# Patient Record
Sex: Male | Born: 1985 | Race: White | Hispanic: No | Marital: Single | Smoking: Current every day smoker
Health system: Southern US, Community
[De-identification: ages and names within clinical notes are randomized; demographics above are authoritative.]

---
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
## 2004-07-11 ENCOUNTER — Emergency Department (HOSPITAL_COMMUNITY): Admission: EM | Admit: 2004-07-11 | Discharge: 2004-07-11 | Payer: Self-pay | Admitting: Emergency Medicine
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
## 2012-11-10 ENCOUNTER — Ambulatory Visit (INDEPENDENT_AMBULATORY_CARE_PROVIDER_SITE_OTHER): Payer: Self-pay | Admitting: Family Medicine

## 2012-11-10 ENCOUNTER — Encounter: Payer: Self-pay | Admitting: Family Medicine

## 2012-11-10 VITALS — BP 132/86 | Ht 68.0 in | Wt 210.0 lb

## 2012-11-10 DIAGNOSIS — R454 Irritability and anger: Secondary | ICD-10-CM

## 2012-11-10 DIAGNOSIS — R4589 Other symptoms and signs involving emotional state: Secondary | ICD-10-CM

## 2012-11-10 MED ORDER — CITALOPRAM HYDROBROMIDE 20 MG PO TABS: 20.0000 mg | ORAL_TABLET | Freq: Every day | ORAL | Status: DC

## 2012-11-10 NOTE — Progress Notes (Signed)
  Subjective:    Patient ID: Timothy Mccoy, male    DOB: Mar 11, 1985, 27 y.o.   MRN: [id number]  HPIHere for behavior issues and anger issues.   This young man has some difficult time controlling his anger he has times where he gets a little bit upset he denies wanting to hurt himself or anyone else. He states he once to try some medication. He relates that one time he had ADD issues. He also has had some issues in the past getting angry quickly but he denies being homicidal or suicidal.  Review of Systems Negative for headaches fevers vomiting    Objective:   Physical Exam Lungs are clear hearts regular patient is able to smile and interact no unusual behavior       Assessment & Plan:  Anger issues occasional feeling blue-Celexa 20 mg half tablet daily for the first week then one tablet daily followup in 2 weeks if suicidal tendencies or severe depression immediately seek help here or ER  Referral to date [facility] for behavioral evaluation and treatment  Hold off on ADD medicines currently

## 2012-11-24 ENCOUNTER — Encounter: Payer: Self-pay | Admitting: Family Medicine

## 2012-11-24 ENCOUNTER — Ambulatory Visit (INDEPENDENT_AMBULATORY_CARE_PROVIDER_SITE_OTHER): Payer: Self-pay | Admitting: Family Medicine

## 2012-11-24 VITALS — BP 130/90 | Ht 70.0 in | Wt 212.0 lb

## 2012-11-24 DIAGNOSIS — Z569 Unspecified problems related to employment: Secondary | ICD-10-CM

## 2012-11-24 DIAGNOSIS — F988 Other specified behavioral and emotional disorders with onset usually occurring in childhood and adolescence: Secondary | ICD-10-CM

## 2012-11-24 DIAGNOSIS — Z566 Other physical and mental strain related to work: Secondary | ICD-10-CM

## 2012-11-24 MED ORDER — AMPHETAMINE-DEXTROAMPHET ER 20 MG PO CP24: 20.0000 mg | ORAL_CAPSULE | ORAL | Status: DC

## 2012-11-24 NOTE — Progress Notes (Signed)
  Subjective:    Patient ID: Timothy Mccoy, male    DOB: July 29, 1985, 27 y.o.   MRN: [id number]  Anxiety Presents for follow-up visit. Onset was 1 to 6 months ago. The problem has been gradually improving. Symptoms occur rarely. The severity of symptoms is mild. Nothing aggravates the symptoms. The quality of sleep is poor. Nighttime awakenings: several.   There are no known risk factors. Past treatments include SSRIs. The treatment provided moderate relief. Compliance with prior treatments has been good. Compliance with medications is 76-100%.   Patient does relate that he's had a cold recently head congestion drainage coughing denies any wheezing vomiting or high fevers.  Patient does relate Celexa is helping greatly with his moods he is having a hard time focusing he would like to try ADD medicine again. He had ADD as it use. Been having problems recently staying focused in on track.   Review of Systems Denies headaches sweats chills fevers nausea vomiting diarrhea    Objective:   Physical Exam Lungs are clear hearts regular pulse normal extremities no edema skin warm dry neurologic grossly normal       Assessment & Plan:  Viral URI if he doesn't shake it and then patient is to let us know Stress-Celexa seems to be doing good denies being depressed no suicidal ideation Patient is aware that if he starts feeling depressed or suicidal ideation immediately to followup(he was warned that medication could cause these type of side effect) ADD- Adderall XR prescribed. We will see how that does.

## 2013-02-25 ENCOUNTER — Ambulatory Visit: Payer: Self-pay | Admitting: Family Medicine

## 2013-03-04 ENCOUNTER — Ambulatory Visit (INDEPENDENT_AMBULATORY_CARE_PROVIDER_SITE_OTHER): Payer: 59 | Admitting: Family Medicine

## 2013-03-04 ENCOUNTER — Encounter: Payer: Self-pay | Admitting: Family Medicine

## 2013-03-04 VITALS — BP 122/80 | Temp 99.1°F | Ht 70.0 in | Wt 216.0 lb

## 2013-03-04 DIAGNOSIS — J019 Acute sinusitis, unspecified: Secondary | ICD-10-CM

## 2013-03-04 DIAGNOSIS — K219 Gastro-esophageal reflux disease without esophagitis: Secondary | ICD-10-CM

## 2013-03-04 DIAGNOSIS — F988 Other specified behavioral and emotional disorders with onset usually occurring in childhood and adolescence: Secondary | ICD-10-CM

## 2013-03-04 DIAGNOSIS — K649 Unspecified hemorrhoids: Secondary | ICD-10-CM

## 2013-03-04 DIAGNOSIS — L301 Dyshidrosis [pompholyx]: Secondary | ICD-10-CM

## 2013-03-04 MED ORDER — PANTOPRAZOLE SODIUM 40 MG PO TBEC: 40.0000 mg | DELAYED_RELEASE_TABLET | Freq: Every day | ORAL | Status: DC

## 2013-03-04 MED ORDER — AMPHETAMINE-DEXTROAMPHET ER 20 MG PO CP24: 20.0000 mg | ORAL_CAPSULE | ORAL | Status: DC

## 2013-03-04 MED ORDER — CITALOPRAM HYDROBROMIDE 20 MG PO TABS: 20.0000 mg | ORAL_TABLET | Freq: Every day | ORAL | Status: DC

## 2013-03-04 MED ORDER — SULFAMETHOXAZOLE-TRIMETHOPRIM 800-160 MG PO TABS: 1.0000 | ORAL_TABLET | Freq: Two times a day (BID) | ORAL | Status: DC

## 2013-03-04 MED ORDER — TRIAMCINOLONE ACETONIDE 0.1 % EX CREA: 1.0000 "application " | TOPICAL_CREAM | Freq: Two times a day (BID) | CUTANEOUS | Status: DC

## 2013-03-04 NOTE — Patient Instructions (Signed)

## 2013-03-04 NOTE — Progress Notes (Signed)
   Subjective:    Patient ID: Timothy Mccoy, male    DOB: 04/17/1985, 28 y.o.   MRN: [id number]  HPIADD check up. He states it helps. Patient states does help his focus that helps him stay focused at work he likes the way it helps him he would like to continue it he denies abusing it. Patient was told that it was be impossible to cover every single problem he is having today.  Right foot pain for two years. Uses lotion on the spot that is hurting. He relates he gets intermittent itching that he gets small bumps under the skin then they often blister a little bit then itch intensely.  Right arm pain. Hurt right arm bowling 2 - 3 months ago.   Loss voice about 1 month ago. Had a bad cold, then lost voice, some phlehgm , no fever, cough to the point of losing breath, no wheeze, gets light headed with coughing No chills, no sweats  Vomiting every morning for 2 -3 years. Has regular issues in the am with vomiting. Has reflux Sx , has had some nocturnal symptoms. Patient relates a fair amount of reflux symptoms burning in the chest no wheezing or difficulty breathing with it. He's had this ongoing for years just hadn't taken anything because he didn't have insurance until now  Hemorroids. He was advised to use stool softeners if ongoing trouble may need referral to surgeon  Review of Systems  Constitutional: Negative for fever and activity change.  HENT: Positive for congestion and rhinorrhea. Negative for ear pain.   Eyes: Negative for discharge.  Respiratory: Positive for cough. Negative for wheezing.   Cardiovascular: Negative for chest pain.       Objective:   Physical Exam  Nursing note and vitals reviewed. Constitutional: He appears well-developed.  HENT:  Head: Normocephalic.  Mouth/Throat: Oropharynx is clear and moist. No oropharyngeal exudate.  Neck: Normal range of motion.  Cardiovascular: Normal rate, regular rhythm and normal heart sounds.   No murmur  heard. Pulmonary/Chest: Effort normal and breath sounds normal. He has no wheezes.  Lymphadenopathy:    He has no cervical adenopathy.  Neurological: He exhibits normal muscle tone.  Skin: Skin is warm and dry.          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 GERD-he needs to minimize spicy foods tomato-based products. Also encouraged him to take the prescription medicine on a daily basis if not doing better over the course of next month to let us know I don't feel he needs EGD at this time. SINUSITIS-I believe the hoarseness in his voice is related to sinus infection postnasal drainage he will go ahead with antibiotics if not better in 2 weeks' time he'll let us know ADD-he was given 3 prescriptions today this will last him for 3 months he'll followup at that time

## 2013-03-11 ENCOUNTER — Telehealth: Payer: Self-pay | Admitting: Family Medicine

## 2013-03-11 NOTE — Telephone Encounter (Signed)
Pt's AMPHETAMINE/DEXTROAMPHETAMINE was DENIED by [facility]([employer]), please see denial on front of paper chart, please advise

## 2013-03-11 NOTE — Telephone Encounter (Signed)
I can prescribe the brand name. May still need prior approval. Wouldn't surprise me if are friendly insurance company denies this and makes us try something different. Certainly would help if they told us which medicine they would cover in the first place.

## 2013-03-11 NOTE — Telephone Encounter (Signed)
[facility] at cell# [phone]

## 2013-03-12 NOTE — Telephone Encounter (Signed)
Dr. Lorin PicketScott wrote new Rx for pt, is up front for pick up, notified pt, he states he will pick up when he can, due to work schedule may be a while

## 2013-04-15 ENCOUNTER — Telehealth: Payer: Self-pay | Admitting: Family Medicine

## 2013-04-15 NOTE — Telephone Encounter (Signed)
If possible please find out from the patient what insurance he currently has. We can send in the prescription to his pharmacy.

## 2013-04-15 NOTE — Telephone Encounter (Signed)
Patients insurance is not long covering pantoprazole (PROTONIX) 40 MG tablet and he said he had a bad spell last night and would like an alternative called in if any way.   [employer] [facility]

## 2013-04-16 ENCOUNTER — Other Ambulatory Visit: Payer: Self-pay | Admitting: Family Medicine

## 2013-04-16 NOTE — Telephone Encounter (Signed)
[employer]

## 2013-04-16 NOTE — Telephone Encounter (Signed)
Enid DerryBrendale, if possible could you get me [facility] is list for proton pump inhibitors thank you

## 2013-04-16 NOTE — Telephone Encounter (Signed)
See list on top of paper chart

## 2013-04-21 NOTE — Telephone Encounter (Signed)
Send in Prilosec 20 mg 1 daily, #30, 4 refills. Apparently this is the only thing that is covered by his insurance. Thank you.

## 2013-04-22 ENCOUNTER — Other Ambulatory Visit: Payer: Self-pay | Admitting: *Deleted

## 2013-04-22 MED ORDER — OMEPRAZOLE 20 MG PO CPDR: 20.0000 mg | DELAYED_RELEASE_CAPSULE | Freq: Every day | ORAL | Status: DC

## 2013-04-22 NOTE — Telephone Encounter (Signed)
Med sent to pharm. Pt notified.  

## 2013-05-19 ENCOUNTER — Telehealth: Payer: Self-pay | Admitting: Family Medicine

## 2013-05-19 MED ORDER — AMPHETAMINE-DEXTROAMPHET ER 20 MG PO CP24: 20.0000 mg | ORAL_CAPSULE | ORAL | Status: DC

## 2013-05-19 NOTE — Telephone Encounter (Signed)
Wife was notified script ready for pickup and keep follow up visit.

## 2013-05-19 NOTE — Telephone Encounter (Signed)
May have refill. Will need office visit before further refills.

## 2013-05-19 NOTE — Telephone Encounter (Signed)
Calling to get refills on patients Adderall.

## 2013-05-19 NOTE — Telephone Encounter (Signed)
Last seen 03/04/13 

## 2013-06-01 ENCOUNTER — Ambulatory Visit (INDEPENDENT_AMBULATORY_CARE_PROVIDER_SITE_OTHER): Payer: 59 | Admitting: Family Medicine

## 2013-06-01 ENCOUNTER — Encounter: Payer: Self-pay | Admitting: Family Medicine

## 2013-06-01 VITALS — BP 128/80 | Ht 70.0 in | Wt 208.0 lb

## 2013-06-01 DIAGNOSIS — Z23 Encounter for immunization: Secondary | ICD-10-CM

## 2013-06-01 DIAGNOSIS — F988 Other specified behavioral and emotional disorders with onset usually occurring in childhood and adolescence: Secondary | ICD-10-CM

## 2013-06-01 DIAGNOSIS — Z131 Encounter for screening for diabetes mellitus: Secondary | ICD-10-CM

## 2013-06-01 DIAGNOSIS — Z13220 Encounter for screening for lipoid disorders: Secondary | ICD-10-CM

## 2013-06-01 LAB — LIPID PANEL
CHOL/HDL RATIO: 5 ratio
Cholesterol: 169 mg/dL (ref 0–200)
HDL: 34 mg/dL — AB (ref >39)
LDL CALC: 97 mg/dL (ref 0–99)
TRIGLYCERIDES: 188 mg/dL — AB (ref <150)
VLDL: 38 mg/dL (ref 0–40)

## 2013-06-01 LAB — GLUCOSE, RANDOM: Glucose, Bld: 81 mg/dL (ref 70–99)

## 2013-06-01 MED ORDER — AMPHETAMINE-DEXTROAMPHET ER 20 MG PO CP24: 20.0000 mg | ORAL_CAPSULE | ORAL | Status: DC

## 2013-06-01 NOTE — Patient Instructions (Signed)
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
## 2013-06-02 ENCOUNTER — Encounter: Payer: Self-pay | Admitting: Family Medicine

## 2013-08-27 ENCOUNTER — Telehealth: Payer: Self-pay | Admitting: Family Medicine

## 2013-08-27 NOTE — Telephone Encounter (Signed)
Patient does not need prior authorization for Adderall. He may go ahead and fill his Rx.

## 2013-08-31 ENCOUNTER — Ambulatory Visit: Payer: 59 | Admitting: Family Medicine

## 2013-08-31 ENCOUNTER — Encounter: Payer: Self-pay | Admitting: Family Medicine

## 2013-08-31 ENCOUNTER — Ambulatory Visit (INDEPENDENT_AMBULATORY_CARE_PROVIDER_SITE_OTHER): Payer: 59 | Admitting: Family Medicine

## 2013-08-31 VITALS — BP 132/88 | Ht 70.0 in | Wt 200.0 lb

## 2013-08-31 DIAGNOSIS — F988 Other specified behavioral and emotional disorders with onset usually occurring in childhood and adolescence: Secondary | ICD-10-CM

## 2013-08-31 MED ORDER — AMPHETAMINE-DEXTROAMPHET ER 20 MG PO CP24: 20.0000 mg | ORAL_CAPSULE | ORAL | Status: DC

## 2013-08-31 MED ORDER — CITALOPRAM HYDROBROMIDE 20 MG PO TABS: 20.0000 mg | ORAL_TABLET | Freq: Every day | ORAL | Status: DC

## 2013-08-31 NOTE — Progress Notes (Signed)
   Subjective:    Patient ID: Timothy Mccoy, male    DOB: 11/21/1985, 28 y.o.   MRN: [id number]  HPI Patient is here today for 3 month check up on his ADD. He needs a refill on his Adderall.   No concerns.  Patient was seen today for ADD checkup. -weight, vital signs reviewed.  The following items were covered. -Compliance with medication : Overall good  -Problems with completing homework, paying attention/taking good notes in school: This helps his focus at work  -grades: Nonapplicable  - Eating patterns : Doing well has lost some weight trying to lose weight  -sleeping: Sleeps fine no trouble  -Additional issues or questions: He is uncertain if his insurance company will cover his medicine he's done well in this medicine I would like for him to stay on    Review of Systems  Constitutional: Negative for activity change, appetite change and fatigue.  Gastrointestinal: Negative for abdominal pain.  Neurological: Negative for headaches.  Psychiatric/Behavioral: Negative for behavioral problems.       Objective:   Physical Exam  Vitals reviewed. Constitutional: He appears well-nourished.  Cardiovascular: Normal rate, regular rhythm and normal heart sounds.   No murmur heard. Pulmonary/Chest: Effort normal and breath sounds normal.  Musculoskeletal: He exhibits no edema.  Lymphadenopathy:    He has no cervical adenopathy.  Neurological: He is alert.  Psychiatric: His behavior is normal.          Assessment & Plan:  ADD-prescriptions given. Patient handling stress at work fairly well. He denies any specific troubles otherwise. He will followup in 3 months sooner if any issues

## 2013-09-01 ENCOUNTER — Encounter: Payer: Self-pay | Admitting: Family Medicine

## 2013-10-20 ENCOUNTER — Ambulatory Visit (INDEPENDENT_AMBULATORY_CARE_PROVIDER_SITE_OTHER): Payer: 59 | Admitting: Family Medicine

## 2013-10-20 ENCOUNTER — Encounter: Payer: Self-pay | Admitting: Family Medicine

## 2013-10-20 VITALS — BP 134/82 | Temp 98.6°F | Ht 70.0 in

## 2013-10-20 DIAGNOSIS — K1120 Sialoadenitis, unspecified: Secondary | ICD-10-CM

## 2013-10-20 MED ORDER — CHLORZOXAZONE 500 MG PO TABS: 500.0000 mg | ORAL_TABLET | Freq: Three times a day (TID) | ORAL | Status: DC | PRN

## 2013-10-20 MED ORDER — CLINDAMYCIN HCL 300 MG PO CAPS: 300.0000 mg | ORAL_CAPSULE | Freq: Three times a day (TID) | ORAL | Status: AC

## 2013-10-20 NOTE — Progress Notes (Signed)
   Subjective:    Patient ID: Timothy Mccoy, male    DOB: [birth year], 28 y.o.   MRN: [id number]  HPI Patient has been having this issue since last week. The pain is on the right side & causing difficulty swallowing. Patient states that he & his family had a URI prior to this issue. The patient denies chest tightness pressure pain relates soreness underneath the tongue on the right side does not chew tobacco. Denies any bleeding from it or fevers.  Review of Systems     Objective:   Physical Exam  This patient has a swollen tender every morning this underneath the tongue on the right side. There is no mass felt with it. The neck is normal lungs are clear heart is regular in your drums normal      Assessment & Plan:  Salivary gland infection-antibiotics prescribed if not doing well in 7 days followup followup sooner if high fevers or worse

## 2013-12-01 ENCOUNTER — Ambulatory Visit (INDEPENDENT_AMBULATORY_CARE_PROVIDER_SITE_OTHER): Payer: 59 | Admitting: Family Medicine

## 2013-12-01 ENCOUNTER — Other Ambulatory Visit: Payer: Self-pay | Admitting: Family Medicine

## 2013-12-01 ENCOUNTER — Encounter: Payer: Self-pay | Admitting: Family Medicine

## 2013-12-01 ENCOUNTER — Ambulatory Visit (INDEPENDENT_AMBULATORY_CARE_PROVIDER_SITE_OTHER): Payer: 59 | Admitting: *Deleted

## 2013-12-01 VITALS — BP 134/90 | Ht 70.0 in | Wt 200.4 lb

## 2013-12-01 DIAGNOSIS — F988 Other specified behavioral and emotional disorders with onset usually occurring in childhood and adolescence: Secondary | ICD-10-CM

## 2013-12-01 DIAGNOSIS — F909 Attention-deficit hyperactivity disorder, unspecified type: Secondary | ICD-10-CM

## 2013-12-01 DIAGNOSIS — Z23 Encounter for immunization: Secondary | ICD-10-CM

## 2013-12-01 DIAGNOSIS — J069 Acute upper respiratory infection, unspecified: Secondary | ICD-10-CM

## 2013-12-01 MED ORDER — AMPHETAMINE-DEXTROAMPHET ER 20 MG PO CP24: 20.0000 mg | ORAL_CAPSULE | ORAL | Status: DC

## 2013-12-01 MED ORDER — OMEPRAZOLE 20 MG PO CPDR: 20.0000 mg | DELAYED_RELEASE_CAPSULE | Freq: Every day | ORAL | Status: DC

## 2013-12-01 MED ORDER — CITALOPRAM HYDROBROMIDE 20 MG PO TABS: 20.0000 mg | ORAL_TABLET | Freq: Every day | ORAL | Status: DC
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
## 2013-12-01 NOTE — Progress Notes (Deleted)
   Subjective:    Patient ID: Timothy Mccoy, male    DOB: 04/06/1985, 28 y.o.   MRN: [id number]  HPI Patient was seen today for ADD checkup. -weight, vital signs reviewed.  The following items were covered. -Compliance with medication : ***  -Problems with completing homework, paying attention/taking good notes in school: ***  -grades: ***  - Eating patterns : ***  -sleeping: ***  -Additional issues or questions: ***     Review of Systems     Objective:   Physical Exam        Assessment & Plan:

## 2014-02-25 ENCOUNTER — Ambulatory Visit (INDEPENDENT_AMBULATORY_CARE_PROVIDER_SITE_OTHER): Payer: 59 | Admitting: Family Medicine

## 2014-02-25 ENCOUNTER — Encounter: Payer: Self-pay | Admitting: Family Medicine

## 2014-02-25 VITALS — BP 136/82 | Ht 70.0 in | Wt 203.0 lb

## 2014-02-25 DIAGNOSIS — F909 Attention-deficit hyperactivity disorder, unspecified type: Secondary | ICD-10-CM

## 2014-02-25 DIAGNOSIS — R5383 Other fatigue: Secondary | ICD-10-CM

## 2014-02-25 DIAGNOSIS — K219 Gastro-esophageal reflux disease without esophagitis: Secondary | ICD-10-CM

## 2014-02-25 DIAGNOSIS — F988 Other specified behavioral and emotional disorders with onset usually occurring in childhood and adolescence: Secondary | ICD-10-CM

## 2014-02-25 DIAGNOSIS — G4700 Insomnia, unspecified: Secondary | ICD-10-CM

## 2014-02-25 MED ORDER — AMPHETAMINE-DEXTROAMPHET ER 20 MG PO CP24: 20.0000 mg | ORAL_CAPSULE | ORAL | Status: DC

## 2014-02-25 MED ORDER — CITALOPRAM HYDROBROMIDE 40 MG PO TABS: 40.0000 mg | ORAL_TABLET | Freq: Every day | ORAL | Status: DC

## 2014-02-25 MED ORDER — PANTOPRAZOLE SODIUM 40 MG PO TBEC: 40.0000 mg | DELAYED_RELEASE_TABLET | Freq: Every day | ORAL | Status: DC

## 2014-02-25 NOTE — Progress Notes (Signed)
   Subjective:    Patient ID: Timothy Mccoy, male    DOB: 01/05/1986, 29 y.o.   MRN: [id number]  HPI Patient is here today for a refill on his ADD medicine. He states that this medicine does help him focus. Eats feels that it does help keep him on track he would like to continue it.  He c/o not sleeping well. He wakes up about every hour. We talked about multiple things he could do to try to help his sleep and talked about over-the-counter including melatonin diphenhydramine. Patient states he did not 1 to be on any type of sleeping pill.  Patient relates increased irritability wonders if he needs to go up on his Celexa states a lot of stress at work.   Tries trazodone in the past, and did not like how it made him feel. Made him feel drowsy the next day.     Review of Systems Positive reflux symptoms with epigastric discomfort denies chest tightness pressure pain shortness breath does relate some moderate insomnia issues sometimes difficulty getting asleep sometimes falling asleep.    Objective:   Physical Exam Eardrums normal throat normal neck no masses lungs are clear no crackles respiratory rate is normal heart is regular no murmurs pulse normal blood pressure good extremities no edema abdomen soft mild epigastric tenderness       Assessment & Plan:  ADD-continue current medication 3 prescriptions given patient denies abusing it  Irritability he wonders if he might do better on a higher dose of the medicine he does have some times where he feels stressed sometimes slightly depressed not suicidal. I recommend increasing the medication to 40 mg he will call us back within the next month if he is not doing better with this I did offer referral for counseling he will call us if he feels he wants to do that  Reflux-change medications if not doing better over the next month notify us may need referral to gastroenterology for scoping if ongoing troubles  Sleep difficulty restless legs  also has some findings on sleep survey that indicate the possibility of sleep apnea I believe this patient would benefit from a sleep study. We will connect with him and set this up. He may use melatonin or Benadryl as necessary. His sleep survey does show some tendency toward possible sleep apnea but not severe enough to require a sleep study at this point. If the things we talked about does not completely improve his sleep quality then I would recommend sleep study to evaluate for sleep apnea as well as restless legs

## 2014-02-25 NOTE — Patient Instructions (Signed)

## 2014-05-27 ENCOUNTER — Encounter: Payer: Self-pay | Admitting: Family Medicine

## 2014-05-27 ENCOUNTER — Ambulatory Visit (INDEPENDENT_AMBULATORY_CARE_PROVIDER_SITE_OTHER): Payer: 59 | Admitting: Family Medicine

## 2014-05-27 VITALS — BP 110/70 | Ht 70.0 in | Wt 203.0 lb

## 2014-05-27 DIAGNOSIS — F909 Attention-deficit hyperactivity disorder, unspecified type: Secondary | ICD-10-CM | POA: Diagnosis not present

## 2014-05-27 DIAGNOSIS — F988 Other specified behavioral and emotional disorders with onset usually occurring in childhood and adolescence: Secondary | ICD-10-CM

## 2014-05-27 MED ORDER — AMPHETAMINE-DEXTROAMPHET ER 20 MG PO CP24: 20.0000 mg | ORAL_CAPSULE | ORAL | Status: DC

## 2014-05-27 NOTE — Progress Notes (Signed)
   Subjective:    Patient ID: Betsy CoderJoshua L Suppes, male    DOB: 05/03/1985, 29 y.o.   MRN: [id number]  HPI Patient was seen today for ADD checkup. -weight, vital signs reviewed.  The following items were covered. -Compliance with medication : yes  - Eating patterns : good  -sleeping: good  -Additional issues or questions: none    Review of Systems  Constitutional: Negative for activity change, appetite change and fatigue.  Gastrointestinal: Negative for abdominal pain.  Neurological: Negative for headaches.  Psychiatric/Behavioral: Negative for behavioral problems.       Objective:   Physical Exam  Constitutional: He appears well-developed and well-nourished. No distress.  HENT:  Head: Normocephalic.  Cardiovascular: Normal rate, regular rhythm and normal heart sounds.   No murmur heard. Pulmonary/Chest: Effort normal and breath sounds normal.  Neurological: He is alert.  Skin: Skin is warm and dry.  Psychiatric: He has a normal mood and affect. His behavior is normal.     he relates his moods are doing good denies being depressed or angry      Assessment & Plan:   ADD-medication helping him focus. Continue on current medicine. Patient now heavy Media plannermachinery operator I believe this is helpful for him to be on medicine help him focus. Follow-up 3 months

## 2014-05-27 NOTE — Patient Instructions (Signed)

## 2014-07-31 ENCOUNTER — Other Ambulatory Visit: Payer: Self-pay | Admitting: Family Medicine

## 2014-08-30 ENCOUNTER — Ambulatory Visit (INDEPENDENT_AMBULATORY_CARE_PROVIDER_SITE_OTHER): Payer: 59 | Admitting: Family Medicine

## 2014-08-30 ENCOUNTER — Encounter: Payer: Self-pay | Admitting: Family Medicine

## 2014-08-30 VITALS — BP 122/86 | Ht 70.0 in | Wt 210.0 lb

## 2014-08-30 DIAGNOSIS — F988 Other specified behavioral and emotional disorders with onset usually occurring in childhood and adolescence: Secondary | ICD-10-CM

## 2014-08-30 DIAGNOSIS — K219 Gastro-esophageal reflux disease without esophagitis: Secondary | ICD-10-CM

## 2014-08-30 DIAGNOSIS — F909 Attention-deficit hyperactivity disorder, unspecified type: Secondary | ICD-10-CM | POA: Diagnosis not present

## 2014-08-30 MED ORDER — AMPHETAMINE-DEXTROAMPHET ER 20 MG PO CP24: 20.0000 mg | ORAL_CAPSULE | ORAL | Status: DC

## 2014-08-30 MED ORDER — PANTOPRAZOLE SODIUM 40 MG PO TBEC: 40.0000 mg | DELAYED_RELEASE_TABLET | Freq: Every day | ORAL | Status: DC

## 2014-08-30 NOTE — Progress Notes (Signed)
   Subjective:    Patient ID: Timothy Mccoy, male    DOB: 1985/07/01, 29 y.o.   MRN: [id number]  HPI  Patient was seen today for ADD checkup. -weight, vital signs reviewed.  The following items were covered. -Compliance with medication : good  -Problems with completing homework, paying attention/taking good notes in school: (N/A)  -grades: (N/A)  - Eating patterns : somewhat  -sleeping: sleeping improving patient states that he has been taking melatonin   -Additional issues or questions: Patient would like to discuss rash to left lower leg.  Review of Systems  Constitutional: Negative for activity change, appetite change and fatigue.  Gastrointestinal: Negative for abdominal pain.  Neurological: Negative for headaches.  Psychiatric/Behavioral: Negative for behavioral problems.       Objective:   Physical Exam  Constitutional: He appears well-developed and well-nourished. No distress.  HENT:  Head: Normocephalic.  Cardiovascular: Normal rate, regular rhythm and normal heart sounds.   No murmur heard. Pulmonary/Chest: Effort normal and breath sounds normal.  Neurological: He is alert.  Skin: Skin is warm and dry.  Psychiatric: He has a normal mood and affect. His behavior is normal.    Multiple bites on legs He relates the ADD medicine helps him stay focused on the job so he does not forget details     Assessment & Plan:  Patient has multiple bug bites-probably straw mites-patient defers on prednisone currently  Significant ADD issues does well with medication would like to continue with prescription given follow-up 3 months  Significant GERD states he does best with medication continue medication  Citalopram helps him with his moods he would like to continue this  He has significant dental caries he will be seen dentist in the future to have teeth removed

## 2014-10-21 ENCOUNTER — Telehealth: Payer: Self-pay | Admitting: Family Medicine

## 2014-10-21 NOTE — Telephone Encounter (Signed)
Please mail to the patient that information regarding [facility] [employer]. Call the patient let him know you are mailing this. Thank you

## 2014-10-21 NOTE — Telephone Encounter (Signed)
(  Tonia) called to check on referral for dentist that you spoke of in last visit 08/30/14.She stated you were looking for one in [place] he could go to.

## 2014-11-01 ENCOUNTER — Encounter: Payer: Self-pay | Admitting: Family Medicine

## 2014-11-01 ENCOUNTER — Ambulatory Visit (INDEPENDENT_AMBULATORY_CARE_PROVIDER_SITE_OTHER): Payer: 59 | Admitting: Family Medicine

## 2014-11-01 VITALS — BP 122/88 | Temp 98.0°F | Ht 70.0 in | Wt 216.6 lb

## 2014-11-01 DIAGNOSIS — B9689 Other specified bacterial agents as the cause of diseases classified elsewhere: Secondary | ICD-10-CM

## 2014-11-01 DIAGNOSIS — H9201 Otalgia, right ear: Secondary | ICD-10-CM | POA: Diagnosis not present

## 2014-11-01 DIAGNOSIS — J019 Acute sinusitis, unspecified: Secondary | ICD-10-CM

## 2014-11-01 MED ORDER — AMOXICILLIN-POT CLAVULANATE 875-125 MG PO TABS: 1.0000 | ORAL_TABLET | Freq: Two times a day (BID) | ORAL | Status: DC

## 2014-11-01 NOTE — Progress Notes (Signed)
   Subjective:    Patient ID: Timothy Mccoy, male    DOB: 02-14-1985, 29 y.o.   MRN: [id number]  HPI  Patient arrives with complaint that he can not hear out of right ear. Patient also having runny nose and cough. Patient states started off about a week ago head congestion drainage coughing now is having chest congestion head congestion right ear discomfort difficulty hearing from the ear. PMH ADD Review of Systems  Constitutional: Negative for fever and activity change.  HENT: Positive for congestion and rhinorrhea. Negative for ear pain.   Eyes: Negative for discharge.  Respiratory: Positive for cough. Negative for wheezing.   Cardiovascular: Negative for chest pain.       Objective:   Physical Exam  Constitutional: He appears well-developed.  HENT:  Head: Normocephalic.  Mouth/Throat: Oropharynx is clear and moist. No oropharyngeal exudate.  Neck: Normal range of motion.  Cardiovascular: Normal rate, regular rhythm and normal heart sounds.   No murmur heard. Pulmonary/Chest: Effort normal and breath sounds normal. He has no wheezes.  Lymphadenopathy:    He has no cervical adenopathy.  Neurological: He exhibits normal muscle tone.  Skin: Skin is warm and dry.  Nursing note and vitals reviewed.         Assessment & Plan:  Sinusitis secondary to viral syndrome causing right ear discomfort difficulty hearing antibiotics recommended follow-up if progressive troubles follow-up if any issues.

## 2014-11-30 ENCOUNTER — Encounter: Payer: Self-pay | Admitting: Family Medicine

## 2014-11-30 ENCOUNTER — Ambulatory Visit (INDEPENDENT_AMBULATORY_CARE_PROVIDER_SITE_OTHER): Payer: 59 | Admitting: Family Medicine

## 2014-11-30 VITALS — BP 122/80 | Ht 70.0 in | Wt 210.6 lb

## 2014-11-30 DIAGNOSIS — F988 Other specified behavioral and emotional disorders with onset usually occurring in childhood and adolescence: Secondary | ICD-10-CM

## 2014-11-30 DIAGNOSIS — F909 Attention-deficit hyperactivity disorder, unspecified type: Secondary | ICD-10-CM

## 2014-11-30 MED ORDER — AMPHETAMINE-DEXTROAMPHET ER 20 MG PO CP24: 20.0000 mg | ORAL_CAPSULE | ORAL | Status: DC

## 2014-11-30 MED ORDER — CITALOPRAM HYDROBROMIDE 20 MG PO TABS: 20.0000 mg | ORAL_TABLET | Freq: Every day | ORAL | Status: DC

## 2014-11-30 NOTE — Progress Notes (Signed)
   Subjective:    Patient ID: Timothy Mccoy, male    DOB: 10/23/1985, 29 y.o.   MRN: [id number]  HPI  Patient arrives for a follow up on ADHD. Patient on Adderall XR 20 mg. Doing well with current medication Patient denies any particular problems states medicine doing well needs refills  Review of Systems  Constitutional: Negative for activity change, appetite change and fatigue.  Gastrointestinal: Negative for abdominal pain.  Neurological: Negative for headaches.  Psychiatric/Behavioral: Negative for behavioral problems.       Objective:   Physical Exam  Constitutional: He appears well-developed and well-nourished. No distress.  HENT:  Head: Normocephalic.  Cardiovascular: Normal rate, regular rhythm and normal heart sounds.   No murmur heard. Pulmonary/Chest: Effort normal and breath sounds normal.  Neurological: He is alert.  Skin: Skin is warm and dry.  Psychiatric: He has a normal mood and affect. His behavior is normal.    abd soft Patient states acid blocker is helping.     Assessment & Plan:  ADD-benefits from medication continue it Stress level Celexa seems do well he would prefer lower dose we will adjust the the dose to 20 mg

## 2014-11-30 NOTE — Patient Instructions (Signed)

## 2015-03-02 ENCOUNTER — Ambulatory Visit: Payer: 59 | Admitting: Family Medicine

## 2016-01-03 ENCOUNTER — Encounter: Payer: Self-pay | Admitting: Family Medicine

## 2016-01-03 ENCOUNTER — Ambulatory Visit (INDEPENDENT_AMBULATORY_CARE_PROVIDER_SITE_OTHER): Payer: Self-pay | Admitting: Family Medicine

## 2016-01-03 VITALS — BP 122/80 | Temp 98.0°F | Ht 70.0 in | Wt 219.1 lb

## 2016-01-03 DIAGNOSIS — K219 Gastro-esophageal reflux disease without esophagitis: Secondary | ICD-10-CM

## 2016-01-03 DIAGNOSIS — H1033 Unspecified acute conjunctivitis, bilateral: Secondary | ICD-10-CM

## 2016-01-03 MED ORDER — SULFACETAMIDE SODIUM 10 % OP SOLN: 2.0000 [drp] | Freq: Four times a day (QID) | OPHTHALMIC | 0 refills | Status: DC

## 2016-01-03 NOTE — Progress Notes (Signed)
   Subjective:    Patient ID: Timothy Mccoy, male    DOB: 10/20/1985, 30 y.o.   MRN: [id number]  Emesis   This is a new problem. The current episode started in the past 7 days. The problem occurs intermittently. The problem has been unchanged. There has been no fever. Associated symptoms comments: Red eyes. Treatments tried: red eyes. The treatment provided no relief.  Patient is mainly here because of bilateral eye watering crusting symptoms over the past several days no runny nose cough fever has had some problems with vomiting related to his reflux desiring for recommendation regarding OTC medicine    Review of Systems  Gastrointestinal: Positive for vomiting.  Patient denies fever cough wheezing does relate watery eyes crusting     Objective:   Physical Exam Eardrums normal throat normal bilateral conjunctivitis noted not severe lungs clear heart regular abdomen soft       Assessment & Plan:  Reflux-recommend stay away from smoking. Anti-reflex diet, follow-up if progressive troubles, omeprazole 20 mg daily, recheck if problems  Bilateral conjunctivitis antibiotic eyedrops prescribed if not improved over the next several days follow-up

## 2017-06-05 ENCOUNTER — Encounter: Payer: Self-pay | Admitting: Family Medicine

## 2017-06-05 ENCOUNTER — Ambulatory Visit: Payer: BLUE CROSS/BLUE SHIELD | Admitting: Family Medicine

## 2017-06-05 VITALS — BP 138/90 | Ht 70.0 in | Wt 200.0 lb

## 2017-06-05 DIAGNOSIS — J018 Other acute sinusitis: Secondary | ICD-10-CM

## 2017-06-05 MED ORDER — ALBUTEROL SULFATE HFA 108 (90 BASE) MCG/ACT IN AERS: 2.0000 | INHALATION_SPRAY | Freq: Four times a day (QID) | RESPIRATORY_TRACT | 2 refills | Status: DC | PRN

## 2017-06-05 MED ORDER — PREDNISONE 20 MG PO TABS: ORAL_TABLET | ORAL | 0 refills | Status: DC

## 2017-06-05 MED ORDER — DOXYCYCLINE HYCLATE 100 MG PO TABS: 100.0000 mg | ORAL_TABLET | Freq: Two times a day (BID) | ORAL | 0 refills | Status: DC

## 2017-06-05 NOTE — Progress Notes (Signed)
   Subjective:    Patient ID: Timothy Mccoy, male    DOB: 1985-04-10, 32 y.o.   MRN: [id number]  HPI  Patient is here today with complaints of sinus infection and fever, vomiting,diarrhea,cough,wheezing,runny nose,headache,sore throat,fever,abd pain,chest pain/sorness from ribs all started on May 4,2019. Significant head congestion drainage coughing no wheezing difficulty breathing no nausea vomiting diarrhea energy level Review of Systems  Constitutional: Negative for activity change, chills and fever.  HENT: Positive for congestion and rhinorrhea. Negative for ear pain.   Eyes: Negative for discharge.  Respiratory: Positive for cough. Negative for wheezing.   Cardiovascular: Negative for chest pain.  Gastrointestinal: Negative for nausea and vomiting.  Musculoskeletal: Negative for arthralgias.       Objective:   Physical Exam  Constitutional: He appears well-developed.  HENT:  Head: Normocephalic.  Mouth/Throat: Oropharynx is clear and moist. No oropharyngeal exudate.  Eyes: Right eye exhibits no discharge. Left eye exhibits no discharge.  Neck: Normal range of motion.  Cardiovascular: Normal rate, regular rhythm and normal heart sounds.  No murmur heard. Pulmonary/Chest: Effort normal. No respiratory distress. He has wheezes. He has no rales.  Lymphadenopathy:    He has no cervical adenopathy.  Neurological: He exhibits normal muscle tone.  Skin: Skin is warm and dry.  Nursing note and vitals reviewed.  Patient encouraged to quit smoking       Assessment & Plan:  Sinusitis Secondary reactive airway very mild Albuterol if needed Prednisone taper Antibiotics prescribed for sinuses Follow-up if problems  X-rays lab work not indicated currently follow-up if ongoing illness

## 2018-09-11 ENCOUNTER — Other Ambulatory Visit: Payer: Self-pay

## 2018-09-11 ENCOUNTER — Ambulatory Visit (INDEPENDENT_AMBULATORY_CARE_PROVIDER_SITE_OTHER): Payer: BC Managed Care – PPO | Admitting: Family Medicine

## 2018-09-11 VITALS — Temp 98.0°F

## 2018-09-11 DIAGNOSIS — R1031 Right lower quadrant pain: Secondary | ICD-10-CM | POA: Diagnosis not present

## 2018-09-11 NOTE — Progress Notes (Signed)
   Subjective:    Patient ID: Timothy Mccoy, male    DOB: 07-18-1985, 33 y.o.   MRN: [id number]  HPI Temperature 98 Patient calls with RLQ stomach pain that started this am. Patient relates he had onset this morning of some discomfort in the right lower quadrant he states at first it was sharp pains but now it is fairly constant it does come and go to some degree no nausea but he is belching a lot did not have much of an appetite this midmorning the pain is bad enough to where he had to leave work He sought our help He denied diarrhea but did state he had a soft bowel movement earlier today that was a little loose but not diarrhea Denies fever chills vomiting denies sore throat congestion body aches headache PMH benign Review of Systems  Constitutional: Negative for activity change.  HENT: Negative for congestion and rhinorrhea.   Respiratory: Negative for cough and shortness of breath.   Cardiovascular: Negative for chest pain.  Gastrointestinal: Positive for abdominal pain. Negative for diarrhea, nausea and vomiting.  Genitourinary: Negative for dysuria and hematuria.  Neurological: Negative for weakness and headaches.  Psychiatric/Behavioral: Negative for behavioral problems and confusion.       Objective:   Physical Exam  Neck no masses no adenopathy Patient does not appear toxic Moves slowly Lungs are clear no crackles respiratory rate normal Heart regular no murmurs Abdomen is soft with right lower quadrant tenderness on palpation just right of center in the lower abdomen      Assessment & Plan:  New onset of right lower quadrant tenderness Examination points toward the possibility of developing appendicitis Does need to have further evaluation including lab work and CAT scan on an emergent basis Patient was counseled and sent to the ER Work excuse given through the weekend  I doubt COVID as a possibility but certainly if symptoms progress sometimes COVID can  present as abdominal discomfort

## 2018-12-23 ENCOUNTER — Other Ambulatory Visit: Payer: Self-pay | Admitting: *Deleted

## 2018-12-23 DIAGNOSIS — Z20822 Contact with and (suspected) exposure to covid-19: Secondary | ICD-10-CM

## 2018-12-25 LAB — NOVEL CORONAVIRUS, NAA: SARS-CoV-2, NAA: NOT DETECTED

## 2019-06-08 ENCOUNTER — Telehealth (INDEPENDENT_AMBULATORY_CARE_PROVIDER_SITE_OTHER): Payer: BC Managed Care – PPO | Admitting: Family Medicine

## 2019-06-08 ENCOUNTER — Telehealth: Payer: Self-pay | Admitting: *Deleted

## 2019-06-08 ENCOUNTER — Encounter: Payer: Self-pay | Admitting: Family Medicine

## 2019-06-08 ENCOUNTER — Other Ambulatory Visit: Payer: Self-pay

## 2019-06-08 VITALS — Ht 70.0 in | Wt 220.0 lb

## 2019-06-08 DIAGNOSIS — J208 Acute bronchitis due to other specified organisms: Secondary | ICD-10-CM | POA: Diagnosis not present

## 2019-06-08 MED ORDER — PREDNISONE 20 MG PO TABS: 40.0000 mg | ORAL_TABLET | Freq: Every day | ORAL | 0 refills | Status: AC

## 2019-06-08 MED ORDER — AZITHROMYCIN 250 MG PO TABS: ORAL_TABLET | ORAL | 0 refills | Status: DC

## 2019-06-08 MED ORDER — ALBUTEROL SULFATE HFA 108 (90 BASE) MCG/ACT IN AERS: 2.0000 | INHALATION_SPRAY | RESPIRATORY_TRACT | 0 refills | Status: AC | PRN

## 2019-06-08 MED ORDER — LORATADINE 10 MG PO TABS: 10.0000 mg | ORAL_TABLET | Freq: Every day | ORAL | 0 refills | Status: DC

## 2019-06-08 NOTE — Progress Notes (Addendum)
Patient ID: Timothy Mccoy, male    DOB: May 22, 1985, 34 y.o.   MRN: [id number]   Chief Complaint  Patient presents with  . Cough    cough and congestion. started yesterday. chest discomfortant when coughin  Virtual Visit via Telephone Note  I connected with Betsy Coder on 06/08/19 at 10:30 AM EDT by telephone and verified that I am speaking with the correct person using two identifiers.  Location: Patient: home Provider: office   I discussed the limitations, risks, security and privacy concerns of performing an evaluation and management service by telephone and the availability of in person appointments. I also discussed with the patient that there may be a patient responsible charge related to this service. The patient expressed understanding and agreed to proceed.    Subjective:    HPI Pt stating having difficulty with breathing from lungs, also coughing, itchy throat.  coughing up white-clear sputum.  Was outside this weekend, and has h/o allergies.  Symptoms started yesterday. No fever, no runny nose or congestion.  No ear pain. No n/v/d. No sick contacts at home or at work.  Working in [employer] by himself mostly. Stayed home from work today. Tried sinus medication yesterday, 1x. Temp today 74F at home. Non-smoker. Has had to use inhaler in past for similar symptoms.  No h/o asthma.   Medical History [facility] has no past medical history on file.   Outpatient Encounter Medications as of 06/08/2019  Medication Sig  . albuterol (VENTOLIN HFA) 108 (90 Base) MCG/ACT inhaler Inhale 2 puffs into the lungs every 4 (four) hours as needed for wheezing or shortness of breath.  . loratadine (CLARITIN) 10 MG tablet Take 1 tablet (10 mg total) by mouth daily.  . predniSONE (DELTASONE) 20 MG tablet Take 2 tablets (40 mg total) by mouth daily with breakfast for 5 days.  . [DISCONTINUED] albuterol (PROVENTIL HFA;VENTOLIN HFA) 108 (90 Base) MCG/ACT inhaler Inhale 2 puffs into  the lungs every 6 (six) hours as needed for wheezing.   No facility-administered encounter medications on file as of 06/08/2019.     Review of Systems  Constitutional: Negative for chills and fever.  HENT: Positive for congestion and sore throat. Negative for ear discharge, ear pain, postnasal drip, rhinorrhea, sinus pressure, sinus pain and sneezing.   Eyes: Negative for pain, discharge, redness and itching.  Respiratory: Positive for cough, chest tightness and shortness of breath. Negative for wheezing.   Cardiovascular: Negative for chest pain.  Gastrointestinal: Negative for diarrhea, nausea and vomiting.  Skin: Negative for rash.  Neurological: Negative for headaches.     Vitals Ht 5\' 10"  (1.778 m)   Wt 220 lb (99.8 kg)   BMI 31.57 kg/m   Objective:   Physical Exam  No PE due to phone visit.  Assessment and Plan   1. Acute viral bronchitis   Bronchitis- likely viral. Reviewed usual course of viral illness.   Gave prednisone, albuterol and claritin. Cough syrup otc, prn coughing.  Increase fluid intake. If not improving to call back in 7-10 days, may need antibiotic if not improving.  If not improving needing covid testing.  Worsening shortness of breath, call or rto to office.  Pt in agreement with plan.  Addendum-  Wife drove up in car and saw Dr. 9-10 outside and asking for antibiotics for husband and longer work note.  Gave watch and wait script of azithromycin, if not improving in the next week, may start antibiotics.    Follow Up Instructions:  I discussed the assessment and treatment plan with the patient. The patient was provided an opportunity to ask questions and all were answered. The patient agreed with the plan and demonstrated an understanding of the instructions.   The patient was advised to call back or seek an in-person evaluation if the symptoms worsen or if the condition fails to improve as anticipated.  I provided 15 minutes of  non-face-to-face time during this encounter.

## 2019-06-08 NOTE — Telephone Encounter (Signed)
[name], sharman are scheduled for a virtual visit with your provider today.    Just as we do with appointments in the office, we must obtain your consent to participate.  Your consent will be active for this visit and any virtual visit you may have with one of our providers in the next 365 days.    If you have a MyChart account, I can also send a copy of this consent to you electronically.  All virtual visits are billed to your insurance company just like a traditional visit in the office.  As this is a virtual visit, video technology does not allow for your provider to perform a traditional examination.  This may limit your provider's ability to fully assess your condition.  If your provider identifies any concerns that need to be evaluated in person or the need to arrange testing such as labs, EKG, etc, we will make arrangements to do so.    Although advances in technology are sophisticated, we cannot ensure that it will always work on either your end or our end.  If the connection with a video visit is poor, we may have to switch to a telephone visit.  With either a video or telephone visit, we are not always able to ensure that we have a secure connection.   I need to obtain your verbal consent now.   Are you willing to proceed with your visit today?   Timothy Mccoy has provided verbal consent on 06/08/2019 for a virtual visit (video or telephone).   Kyra Manges, LPN 07/18/5091  2:67 AM

## 2019-06-08 NOTE — Addendum Note (Signed)
Addended by: Annalee Genta on: 06/08/2019 01:16 PM   Modules accepted: Orders

## 2020-07-20 ENCOUNTER — Ambulatory Visit: Payer: BLUE CROSS/BLUE SHIELD | Admitting: Family Medicine

## 2020-07-20 ENCOUNTER — Encounter: Payer: Self-pay | Admitting: Family Medicine

## 2020-07-20 ENCOUNTER — Other Ambulatory Visit: Payer: Self-pay

## 2020-07-20 VITALS — BP 124/88 | HR 107 | Temp 98.8°F | Ht 70.0 in | Wt 216.0 lb

## 2020-07-20 DIAGNOSIS — R109 Unspecified abdominal pain: Secondary | ICD-10-CM

## 2020-07-20 DIAGNOSIS — Z13220 Encounter for screening for lipoid disorders: Secondary | ICD-10-CM

## 2020-07-20 DIAGNOSIS — K219 Gastro-esophageal reflux disease without esophagitis: Secondary | ICD-10-CM

## 2020-07-20 DIAGNOSIS — R110 Nausea: Secondary | ICD-10-CM

## 2020-07-20 MED ORDER — ONDANSETRON HCL 8 MG PO TABS: 8.0000 mg | ORAL_TABLET | Freq: Three times a day (TID) | ORAL | 0 refills | Status: AC | PRN

## 2020-07-20 MED ORDER — PANTOPRAZOLE SODIUM 40 MG PO TBEC: 40.0000 mg | DELAYED_RELEASE_TABLET | Freq: Every day | ORAL | 3 refills | Status: AC

## 2020-07-20 NOTE — Patient Instructions (Signed)
https://www.nhlbi.nih.gov/files/docs/public/heart/dash_brief.pdf">  DASH Eating Plan DASH stands for Dietary Approaches to Stop Hypertension. The DASH eating plan is a healthy eating plan that has been shown to: Reduce high blood pressure (hypertension). Reduce your risk for type 2 diabetes, heart disease, and stroke. Help with weight loss. What are tips for following this plan? Reading food labels Check food labels for the amount of salt (sodium) per serving. Choose foods with less than 5 percent of the Daily Value of sodium. Generally, foods with less than 300 milligrams (mg) of sodium per serving fit into this eating plan. To find whole grains, look for the word "whole" as the first word in the ingredient list. Shopping Buy products labeled as "low-sodium" or "no salt added." Buy fresh foods. Avoid canned foods and pre-made or frozen meals. Cooking Avoid adding salt when cooking. Use salt-free seasonings or herbs instead of table salt or sea salt. Check with your health care provider or pharmacist before using salt substitutes. Do not fry foods. Cook foods using healthy methods such as baking, boiling, grilling, roasting, and broiling instead. Cook with heart-healthy oils, such as olive, canola, avocado, soybean, or sunflower oil. Meal planning  Eat a balanced diet that includes: 4 or more servings of fruits and 4 or more servings of vegetables each day. Try to fill one-half of your plate with fruits and vegetables. 6-8 servings of whole grains each day. Less than 6 oz (170 g) of lean meat, poultry, or fish each day. A 3-oz (85-g) serving of meat is about the same size as a deck of cards. One egg equals 1 oz (28 g). 2-3 servings of low-fat dairy each day. One serving is 1 cup (237 mL). 1 serving of nuts, seeds, or beans 5 times each week. 2-3 servings of heart-healthy fats. Healthy fats called omega-3 fatty acids are found in foods such as walnuts, flaxseeds, fortified milks, and eggs.  These fats are also found in cold-water fish, such as sardines, salmon, and mackerel. Limit how much you eat of: Canned or prepackaged foods. Food that is high in trans fat, such as some fried foods. Food that is high in saturated fat, such as fatty meat. Desserts and other sweets, sugary drinks, and other foods with added sugar. Full-fat dairy products. Do not salt foods before eating. Do not eat more than 4 egg yolks a week. Try to eat at least 2 vegetarian meals a week. Eat more home-cooked food and less restaurant, buffet, and fast food.  Lifestyle When eating at a restaurant, ask that your food be prepared with less salt or no salt, if possible. If you drink alcohol: Limit how much you use to: 0-1 drink a day for women who are not pregnant. 0-2 drinks a day for men. Be aware of how much alcohol is in your drink. In the [place] one drink equals one 12 oz bottle of beer (355 mL), one 5 oz glass of wine (148 mL), or one 1 oz glass of hard liquor (44 mL). General information Avoid eating more than 2,300 mg of salt a day. If you have hypertension, you may need to reduce your sodium intake to 1,500 mg a day. Work with your health care provider to maintain a healthy body weight or to lose weight. Ask what an ideal weight is for you. Get at least 30 minutes of exercise that causes your heart to beat faster (aerobic exercise) most days of the week. Activities may include walking, swimming, or biking. Work with your health care provider   or dietitian to adjust your eating plan to your individual calorie needs. What foods should I eat? Fruits All fresh, dried, or frozen fruit. Canned fruit in natural juice (without addedsugar). Vegetables Fresh or frozen vegetables (raw, steamed, roasted, or grilled). Low-sodium or reduced-sodium tomato and vegetable juice. Low-sodium or reduced-sodium tomatosauce and tomato paste. Low-sodium or reduced-sodium canned vegetables. Grains Whole-grain or  whole-wheat bread. Whole-grain or whole-wheat pasta. Brown rice. Oatmeal. Quinoa. Bulgur. Whole-grain and low-sodium cereals. Pita bread.Low-fat, low-sodium crackers. Whole-wheat flour tortillas. Meats and other proteins Skinless chicken or turkey. Ground chicken or turkey. Pork with fat trimmed off. Fish and seafood. Egg whites. Dried beans, peas, or lentils. Unsalted nuts, nut butters, and seeds. Unsalted canned beans. Lean cuts of beef with fat trimmed off. Low-sodium, lean precooked or cured meat, such as sausages or meatloaves. Dairy Low-fat (1%) or fat-free (skim) milk. Reduced-fat, low-fat, or fat-free cheeses. Nonfat, low-sodium ricotta or cottage cheese. Low-fat or nonfatyogurt. Low-fat, low-sodium cheese. Fats and oils Soft margarine without trans fats. Vegetable oil. Reduced-fat, low-fat, or light mayonnaise and salad dressings (reduced-sodium). Canola, safflower, olive, avocado, soybean, andsunflower oils. Avocado. Seasonings and condiments Herbs. Spices. Seasoning mixes without salt. Other foods Unsalted popcorn and pretzels. Fat-free sweets. The items listed above may not be a complete list of foods and beverages you can eat. Contact a dietitian for more information. What foods should I avoid? Fruits Canned fruit in a light or heavy syrup. Fried fruit. Fruit in cream or buttersauce. Vegetables Creamed or fried vegetables. Vegetables in a cheese sauce. Regular canned vegetables (not low-sodium or reduced-sodium). Regular canned tomato sauce and paste (not low-sodium or reduced-sodium). Regular tomato and vegetable juice(not low-sodium or reduced-sodium). Pickles. Olives. Grains Baked goods made with fat, such as croissants, muffins, or some breads. Drypasta or rice meal packs. Meats and other proteins Fatty cuts of meat. Ribs. Fried meat. Bacon. Bologna, salami, and other precooked or cured meats, such as sausages or meat loaves. Fat from the back of a pig (fatback). Bratwurst.  Salted nuts and seeds. Canned beans with added salt. Canned orsmoked fish. Whole eggs or egg yolks. Chicken or turkey with skin. Dairy Whole or 2% milk, cream, and half-and-half. Whole or full-fat cream cheese. Whole-fat or sweetened yogurt. Full-fat cheese. Nondairy creamers. Whippedtoppings. Processed cheese and cheese spreads. Fats and oils Butter. Stick margarine. Lard. Shortening. Ghee. Bacon fat. Tropical oils, suchas coconut, palm kernel, or palm oil. Seasonings and condiments Onion salt, garlic salt, seasoned salt, table salt, and sea salt. Worcestershire sauce. Tartar sauce. Barbecue sauce. Teriyaki sauce. Soy sauce, including reduced-sodium. Steak sauce. Canned and packaged gravies. Fish sauce. Oyster sauce. Cocktail sauce. Store-bought horseradish. Ketchup. Mustard. Meat flavorings and tenderizers. Bouillon cubes. Hot sauces. Pre-made or packaged marinades. Pre-made or packaged taco seasonings. Relishes. Regular saladdressings. Other foods Salted popcorn and pretzels. The items listed above may not be a complete list of foods and beverages you should avoid. Contact a dietitian for more information. Where to find more information National Heart, Lung, and Blood Institute: www.nhlbi.nih.gov American Heart Association: www.heart.org Academy of Nutrition and Dietetics: www.eatright.org National Kidney Foundation: www.kidney.org Summary The DASH eating plan is a healthy eating plan that has been shown to reduce high blood pressure (hypertension). It may also reduce your risk for type 2 diabetes, heart disease, and stroke. When on the DASH eating plan, aim to eat more fresh fruits and vegetables, whole grains, lean proteins, low-fat dairy, and heart-healthy fats. With the DASH eating plan, you should limit salt (sodium) intake to 2,300   mg a day. If you have hypertension, you may need to reduce your sodium intake to 1,500 mg a day. Work with your health care provider or dietitian to adjust  your eating plan to your individual calorie needs. This information is not intended to replace advice given to you by your health care provider. Make sure you discuss any questions you have with your healthcare provider. Document Revised: 12/19/2018 Document Reviewed: 12/19/2018 Elsevier Patient Education  2022 Elsevier Inc.  

## 2020-07-20 NOTE — Progress Notes (Signed)
   Subjective:    Patient ID: Timothy Mccoy, male    DOB: February 13, 1985, 35 y.o.   MRN: [id number]  HPIpt states he is having nausea, vomiting and abd pain. For 3 days. States his power went out and thinks he ate some bad food. Not tried any treatments.   Trying to eat health Minimizing tomato C/o gerd Puts up with it Trys antacid Bm not bloody  Nl appearance  Intermittent GERD symptoms some nausea and intermittent vomiting over the past few days denies high fever chills trying to stay physically active  Does relate that he is having some focus issues difficulty thinking and following through.  Has history of ADD has not been on medications recently.  Review of Systems     Objective:   Physical Exam  General-in no acute distress Eyes-no discharge Lungs-respiratory rate normal, CTA CV-no murmurs,RRR Extremities skin warm dry no edema Neuro grossly normal Behavior normal, alert Abd- soft      Assessment & Plan:   Adult ADD-hold off on medications currently because blood pressure borderline healthy diet regular activity recommended follow-up in the fall recheck blood pressure at that time if blood pressure is significantly better may entertain restarting medicine  Abdominal discomfort lab work ordered await the results I do not feel the patient needs ultrasound currently  Reflux related issues minimize tomato products chocolates caffeine's.  Recommend Protonix daily notify us if not improved over the course of the next 1 to 2 weeks sooner if any problems  Wellness exam by this fall

## 2020-07-21 ENCOUNTER — Encounter: Payer: Self-pay | Admitting: Family Medicine

## 2020-07-21 ENCOUNTER — Telehealth: Payer: Self-pay | Admitting: Family Medicine

## 2020-07-21 NOTE — Telephone Encounter (Signed)
Please give work note 

## 2020-07-21 NOTE — Telephone Encounter (Signed)
Patient is requesting a work note for 07/21/2020-07/22/2020 and returning on 07/25/2020

## 2021-05-01 ENCOUNTER — Other Ambulatory Visit: Payer: Self-pay | Admitting: Family Medicine

## 2021-05-02 NOTE — Telephone Encounter (Signed)
Patient refusing appointment due to no insurance he stated will take over the counter medication instead. ?

## 2021-10-01 ENCOUNTER — Emergency Department (HOSPITAL_COMMUNITY): Admission: EM | Admit: 2021-10-01 | Discharge: 2021-10-01 | Discharge status: Against Medical Advice | Payer: Self-pay

## 2021-10-01 NOTE — ED Notes (Signed)
Called for second time, pt not located in waiting room

## 2022-04-15 ENCOUNTER — Encounter (HOSPITAL_COMMUNITY): Payer: Self-pay | Admitting: *Deleted

## 2022-04-15 ENCOUNTER — Emergency Department (HOSPITAL_COMMUNITY)
Admission: EM | Admit: 2022-04-15 | Discharge: 2022-04-16 | Disposition: A | Discharge status: Other Acute Inpatient Hospital | Payer: Self-pay | Attending: Emergency Medicine | Admitting: Emergency Medicine

## 2022-04-15 ENCOUNTER — Emergency Department (HOSPITAL_COMMUNITY): Payer: Self-pay

## 2022-04-15 ENCOUNTER — Other Ambulatory Visit: Payer: Self-pay

## 2022-04-15 DIAGNOSIS — F988 Other specified behavioral and emotional disorders with onset usually occurring in childhood and adolescence: Secondary | ICD-10-CM | POA: Diagnosis present

## 2022-04-15 DIAGNOSIS — F17210 Nicotine dependence, cigarettes, uncomplicated: Secondary | ICD-10-CM | POA: Insufficient documentation

## 2022-04-15 DIAGNOSIS — F22 Delusional disorders: Secondary | ICD-10-CM | POA: Insufficient documentation

## 2022-04-15 DIAGNOSIS — Z1152 Encounter for screening for COVID-19: Secondary | ICD-10-CM | POA: Insufficient documentation

## 2022-04-15 DIAGNOSIS — F29 Unspecified psychosis not due to a substance or known physiological condition: Secondary | ICD-10-CM | POA: Insufficient documentation

## 2022-04-15 DIAGNOSIS — R45851 Suicidal ideations: Secondary | ICD-10-CM | POA: Insufficient documentation

## 2022-04-15 DIAGNOSIS — F909 Attention-deficit hyperactivity disorder, unspecified type: Secondary | ICD-10-CM | POA: Insufficient documentation

## 2022-04-15 LAB — CBC WITH DIFFERENTIAL/PLATELET
Abs Immature Granulocytes: 0.02 10*3/uL (ref 0.00–0.07)
Basophils Absolute: 0 10*3/uL (ref 0.0–0.1)
Basophils Relative: 1 %
Eosinophils Absolute: 0.1 10*3/uL (ref 0.0–0.5)
Eosinophils Relative: 1 %
HCT: 43.9 % (ref 39.0–52.0)
Hemoglobin: 14.4 g/dL (ref 13.0–17.0)
Immature Granulocytes: 0 %
Lymphocytes Relative: 18 %
Lymphs Abs: 1.3 10*3/uL (ref 0.7–4.0)
MCH: 27.6 pg (ref 26.0–34.0)
MCHC: 32.8 g/dL (ref 30.0–36.0)
MCV: 84.1 fL (ref 80.0–100.0)
Monocytes Absolute: 0.4 10*3/uL (ref 0.1–1.0)
Monocytes Relative: 6 %
Neutro Abs: 5.4 10*3/uL (ref 1.7–7.7)
Neutrophils Relative %: 74 %
Platelets: 220 10*3/uL (ref 150–400)
RBC: 5.22 MIL/uL (ref 4.22–5.81)
RDW: 12.8 % (ref 11.5–15.5)
WBC: 7.3 10*3/uL (ref 4.0–10.5)
nRBC: 0 % (ref 0.0–0.2)

## 2022-04-15 LAB — RAPID URINE DRUG SCREEN, HOSP PERFORMED
Amphetamines: NOT DETECTED
Barbiturates: NOT DETECTED
Benzodiazepines: NOT DETECTED
Cocaine: NOT DETECTED
Opiates: NOT DETECTED
Tetrahydrocannabinol: NOT DETECTED

## 2022-04-15 LAB — COMPREHENSIVE METABOLIC PANEL
ALT: 28 U/L (ref 0–44)
AST: 31 U/L (ref 15–41)
Albumin: 4.5 g/dL (ref 3.5–5.0)
Alkaline Phosphatase: 88 U/L (ref 38–126)
Anion gap: 8 (ref 5–15)
BUN: 9 mg/dL (ref 6–20)
CO2: 26 mmol/L (ref 22–32)
Calcium: 9.2 mg/dL (ref 8.9–10.3)
Chloride: 103 mmol/L (ref 98–111)
Creatinine, Ser: 0.69 mg/dL (ref 0.61–1.24)
GFR, Estimated: >60 mL/min (ref >60)
Glucose, Bld: 93 mg/dL (ref 70–99)
Potassium: 4.5 mmol/L (ref 3.5–5.1)
Sodium: 137 mmol/L (ref 135–145)
Total Bilirubin: 0.8 mg/dL (ref 0.3–1.2)
Total Protein: 7.8 g/dL (ref 6.5–8.1)

## 2022-04-15 LAB — SALICYLATE LEVEL: Salicylate Lvl: <7 mg/dL — ABNORMAL LOW (ref 7.0–30.0)

## 2022-04-15 LAB — ETHANOL: Alcohol, Ethyl (B): <10 mg/dL (ref <10)

## 2022-04-15 LAB — ACETAMINOPHEN LEVEL: Acetaminophen (Tylenol), Serum: <10 ug/mL — ABNORMAL LOW (ref 10–30)

## 2022-04-15 MED ORDER — ZIPRASIDONE MESYLATE 20 MG IM SOLR: 20.0000 mg | Freq: Two times a day (BID) | INTRAMUSCULAR | Status: DC | PRN

## 2022-04-15 MED ORDER — PANTOPRAZOLE SODIUM 40 MG PO TBEC: 40.0000 mg | DELAYED_RELEASE_TABLET | Freq: Every day | ORAL | Status: DC

## 2022-04-15 MED ORDER — ALBUTEROL SULFATE HFA 108 (90 BASE) MCG/ACT IN AERS: 2.0000 | INHALATION_SPRAY | RESPIRATORY_TRACT | Status: DC | PRN

## 2022-04-15 MED ORDER — PANTOPRAZOLE SODIUM 40 MG PO TBEC
40.0000 mg | DELAYED_RELEASE_TABLET | Freq: Every day | ORAL | Status: DC
  Administered 2022-04-16: 40 mg via ORAL
  Filled 2022-04-15 (×2): qty 1

## 2022-04-15 MED ORDER — DIPHENHYDRAMINE HCL 50 MG/ML IJ SOLN: 25.0000 mg | Freq: Two times a day (BID) | INTRAMUSCULAR | Status: DC | PRN

## 2022-04-15 MED ORDER — ONDANSETRON HCL 4 MG PO TABS: 8.0000 mg | ORAL_TABLET | Freq: Three times a day (TID) | ORAL | Status: DC | PRN

## 2022-04-15 MED ORDER — ALBUTEROL SULFATE (2.5 MG/3ML) 0.083% IN NEBU: 2.5000 mg | INHALATION_SOLUTION | RESPIRATORY_TRACT | Status: DC | PRN

## 2022-04-15 NOTE — BH Assessment (Addendum)
Disposition:   Per Merlyn Lot, NP, patient meets criteria for inpatient psychiatric treatment. Clinician reached out to the [facility] (Antoinette Cillo, RN) to request that patient is reviewed for a [facility] bed. The [facility] confirmed that no beds are available at this time.   The [facility] advised this Clinician to fax patient out for placement. Patient referred to the following facilities for placement.    Destination  Service Provider Request Status Selected Services Address Phone Fax Patient Preferred  [facility]  Pending - Request Sent N/A [address] [place] [address] [phone] --  [place] N/A [address] [place] [address] [phone] --  [facility]  Pending - Request Sent N/A [address] [place] [address] [phone] --  [facility]  Pending - Request Sent N/A [place], [place] [address] [phone] --  [address] [place] [address] [place] [phone] [address] [place] [address] [place] --  [facility]  Pending - Request Sent N/A [address] [place], [place] [address] [id number] --  [facility]  Pending - Request Sent N/A [address] [place] [address] [phone] --  [facility]  Pending - Request Sent N/A [address] [place] [address] [phone] --  [facility]  Pending - Request Sent N/A [address], [place] [address] [phone] --  [facility]  Pending - Request Sent N/A [address], [place] [address] [phone] --  [place]  Pending - Request Sent N/A [address], [place] [address] [id number] --  [facility]  Pending - Request Sent N/A [address] [place] [address] [phone] --  [facility]  Pending - Request Sent N/A [place]., [place] [address] [phone] --  [facility]  Pending - Request Sent N/A [address] [place] [address] [phone] --  [place] N/A [address], [place] [address] [phone] --  [facility]  Pending - Request Sent N/A [address], [place] [address] [phone] --  [facility]  Pending - Request Sent N/A [address] [place], [place] [address] [phone] --  [facility]  Pending - Request Sent N/A [address] [place] [address] [phone] --  [facility]  Pending - Request Sent N/A [address] [place] [address] [phone] --  [place] N/A [place], [place] [address] [phone] --  [facility]  Pending - Request Sent N/A [address] [place] [address] [phone] --  [place] N/A [place] [address] [place] --  [place] [address] [place] [address] [phone] --  [place], [place] [phone] --

## 2022-04-15 NOTE — ED Provider Notes (Addendum)
[facility] Provider Note   CSN: [id number] Arrival date & time: 04/15/22  [id number]     History  No chief complaint on file.   Timothy Mccoy is a 37 y.o. male.  Patient brought on by [facility] department under IVC.  Patient wants himself incarcerated because people are out to get him and he needs to save his life.  Patient states he told Sheriff's department that he wanted to hurt himself.  Patient denies any homicidal ideations.  Patient's past medical history significant for attention deficit disorder does not seem to have any significant behavioral health history.  Patient's medications include albuterol Zofran and Protonix.  Patient is an everyday smoker 2 packs a day.  Correction patient was brought in by [facility] department but he is not IVC.  He was brought in voluntarily.  Based on today's history will IVC him until evaluated by behavioral health.       Home Medications Prior to Admission medications   Medication Sig Start Date End Date Taking? Authorizing Provider  albuterol (VENTOLIN HFA) 108 (90 Base) MCG/ACT inhaler Inhale 2 puffs into the lungs every 4 (four) hours as needed for wheezing or shortness of breath. 06/08/19   Lovena Le, Malena M, DO  ondansetron (ZOFRAN) 8 MG tablet Take 1 tablet (8 mg total) by mouth every 8 (eight) hours as needed for nausea. 07/20/20   Kathyrn Drown, MD  pantoprazole (PROTONIX) 40 MG tablet Take 1 tablet (40 mg total) by mouth daily. 07/20/20   Kathyrn Drown, MD      Allergies    Patient has no known allergies.    Review of Systems   Review of Systems  Constitutional:  Negative for chills and fever.  HENT:  Negative for ear pain and sore throat.   Eyes:  Negative for pain and visual disturbance.  Respiratory:  Negative for cough and shortness of breath.   Cardiovascular:  Negative for chest pain and palpitations.  Gastrointestinal:  Negative for abdominal pain and  vomiting.  Genitourinary:  Negative for dysuria and hematuria.  Musculoskeletal:  Negative for arthralgias and back pain.  Skin:  Negative for color change and rash.  Neurological:  Negative for seizures and syncope.  Psychiatric/Behavioral:  Positive for suicidal ideas.   All other systems reviewed and are negative.   Physical Exam Updated Vital Signs BP (!) 140/95 (BP Location: Left Arm)   Pulse 80   Temp 98.3 F (36.8 C) (Oral)   Resp 20   Ht 1.778 m (5\' 10" )   Wt 86.2 kg   SpO2 99%   BMI 27.26 kg/m  Physical Exam Vitals and nursing note reviewed.  Constitutional:      General: He is not in acute distress.    Appearance: Normal appearance. He is well-developed.  HENT:     Head: Normocephalic and atraumatic.  Eyes:     Extraocular Movements: Extraocular movements intact.     Conjunctiva/sclera: Conjunctivae normal.     Pupils: Pupils are equal, round, and reactive to light.  Cardiovascular:     Rate and Rhythm: Normal rate and regular rhythm.     Heart sounds: No murmur heard. Pulmonary:     Effort: Pulmonary effort is normal. No respiratory distress.     Breath sounds: Normal breath sounds.  Abdominal:     Palpations: Abdomen is soft.     Tenderness: There is no abdominal tenderness.  Musculoskeletal:  General: No swelling.     Cervical back: Normal range of motion and neck supple.  Skin:    General: Skin is warm and dry.     Capillary Refill: Capillary refill takes less than 2 seconds.  Neurological:     General: No focal deficit present.     Mental Status: He is alert and oriented to person, place, and time.  Psychiatric:        Mood and Affect: Mood normal.     ED Results / Procedures / Treatments   Labs (all labs ordered are listed, but only abnormal results are displayed) Labs Reviewed  SALICYLATE LEVEL - Abnormal; Notable for the following components:      Result Value   Salicylate Lvl [id number] (*)    All other components within normal limits   ACETAMINOPHEN LEVEL - Abnormal; Notable for the following components:   Acetaminophen (Tylenol), Serum <10 (*)    All other components within normal limits  RAPID URINE DRUG SCREEN, HOSP PERFORMED  COMPREHENSIVE METABOLIC PANEL  CBC WITH DIFFERENTIAL/PLATELET  ETHANOL    EKG None  Radiology CT Head Wo Contrast  Result Date: 04/15/2022 CLINICAL DATA:  Mental status change. EXAM: CT HEAD WITHOUT CONTRAST TECHNIQUE: Contiguous axial images were obtained from the base of the skull through the vertex without intravenous contrast. RADIATION DOSE REDUCTION: This exam was performed according to the departmental dose-optimization program which includes automated exposure control, adjustment of the mA and/or kV according to patient size and/or use of iterative reconstruction technique. COMPARISON:  None Available. FINDINGS: Brain: No evidence of acute infarction, hemorrhage, hydrocephalus, extra-axial collection or mass lesion/mass effect. Vascular: No hyperdense vessel or unexpected calcification. Skull: Normal. Negative for fracture or focal lesion. Sinuses/Orbits: Retention cyst versus polyps noted within the right maxillary sinus. Mucosal thickening involving the right posterior ethmoid air cell noted. No acute findings. Other: None IMPRESSION: 1. No acute intracranial abnormalities. 2. 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 Electronically Signed   By: Kerby Moors M.D.   On: 04/15/2022 10:42    Procedures Procedures    Medications Ordered in ED Medications  ondansetron (ZOFRAN) tablet 8 mg (has no administration in time range)  albuterol (PROVENTIL) (2.5 MG/3ML) 0.083% nebulizer solution 2.5 mg (has no administration in time range)  pantoprazole (PROTONIX) EC tablet 40 mg (has no administration in time range)    ED Course/ Medical Decision Making/ A&P                             Medical Decision Making Amount and/or Complexity of Data  Reviewed Labs: ordered. Radiology: ordered.  Risk Prescription drug management.   Patient seems to be paranoid says people are out to get him.  He needs to be incarcerated so they cannot get to him.  IVC by [facility] department.  Patient told them that he had suicidal ideation he tells me that he is not really suicidal he just needs to be incarcerated so that these people cannot get to him.  Patient without any significant behavioral health past medical history.  So based on that we will get CT head since he is a little on old side for first psychotic break.  Will also do drug screen as well in case this is drug-induced psychosis.   Patient's home meds ordered.  TTC consult.  Patient medically clear.  Drug screen negative complete metabolic panel normal normal renal function.  CBC no leukocytosis and  hemoglobin normal at 14.4.  Alcohol level not elevated salicylate level normal Tylenol level normal.  CT head without any acute findings.  Will await evaluation of behavioral health.   Final Clinical Impression(s) / ED Diagnoses Final diagnoses:  Suicidal ideation  Paranoid [facility])    Rx / DC Orders ED Discharge Orders     None         Fredia Sorrow, MD 04/15/22 1022    Fredia Sorrow, MD 04/15/22 1030    Fredia Sorrow, MD 04/15/22 1121
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
## 2022-04-15 NOTE — ED Notes (Signed)
Pt on IVC hold.  Dressed out in red [facility] scrub attire.  Pt wanded by security.  All personal belongings placed in lockers.  Belongings consist of pants, shirt, shoes, 1 cigarette, $15 dollars, change, 2 phones, and keys.  Pt resting in bed and compliant at this time.
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
## 2022-04-15 NOTE — ED Notes (Signed)
Dinner tray placed in room.  Pt sleeping at this time.

## 2022-04-16 ENCOUNTER — Inpatient Hospital Stay (HOSPITAL_COMMUNITY)
Admission: AD | Admit: 2022-04-16 | Discharge: 2022-04-27 | DRG: 885 | Disposition: A | Discharge status: Home / Self Care | Payer: No Typology Code available for payment source | Source: Intra-hospital | Attending: Psychiatry | Admitting: Psychiatry

## 2022-04-16 ENCOUNTER — Other Ambulatory Visit: Payer: Self-pay

## 2022-04-16 ENCOUNTER — Encounter (HOSPITAL_COMMUNITY): Payer: Self-pay | Admitting: Psychiatry

## 2022-04-16 DIAGNOSIS — F411 Generalized anxiety disorder: Secondary | ICD-10-CM | POA: Diagnosis present

## 2022-04-16 DIAGNOSIS — F909 Attention-deficit hyperactivity disorder, unspecified type: Secondary | ICD-10-CM | POA: Diagnosis present

## 2022-04-16 DIAGNOSIS — I951 Orthostatic hypotension: Secondary | ICD-10-CM | POA: Diagnosis not present

## 2022-04-16 DIAGNOSIS — T39315A Adverse effect of propionic acid derivatives, initial encounter: Secondary | ICD-10-CM | POA: Diagnosis not present

## 2022-04-16 DIAGNOSIS — Y92239 Unspecified place in hospital as the place of occurrence of the external cause: Secondary | ICD-10-CM | POA: Diagnosis not present

## 2022-04-16 DIAGNOSIS — F312 Bipolar disorder, current episode manic severe with psychotic features: Secondary | ICD-10-CM | POA: Diagnosis present

## 2022-04-16 DIAGNOSIS — L299 Pruritus, unspecified: Secondary | ICD-10-CM | POA: Diagnosis not present

## 2022-04-16 DIAGNOSIS — Z597 Insufficient social insurance and welfare support: Secondary | ICD-10-CM | POA: Diagnosis not present

## 2022-04-16 DIAGNOSIS — G249 Dystonia, unspecified: Secondary | ICD-10-CM | POA: Diagnosis not present

## 2022-04-16 DIAGNOSIS — F4310 Post-traumatic stress disorder, unspecified: Secondary | ICD-10-CM | POA: Diagnosis present

## 2022-04-16 DIAGNOSIS — Z818 Family history of other mental and behavioral disorders: Secondary | ICD-10-CM | POA: Diagnosis not present

## 2022-04-16 DIAGNOSIS — I4510 Unspecified right bundle-branch block: Secondary | ICD-10-CM | POA: Diagnosis present

## 2022-04-16 DIAGNOSIS — G4700 Insomnia, unspecified: Secondary | ICD-10-CM | POA: Diagnosis present

## 2022-04-16 DIAGNOSIS — F17210 Nicotine dependence, cigarettes, uncomplicated: Secondary | ICD-10-CM | POA: Diagnosis present

## 2022-04-16 DIAGNOSIS — R519 Headache, unspecified: Secondary | ICD-10-CM | POA: Diagnosis not present

## 2022-04-16 DIAGNOSIS — Z5900 Homelessness unspecified: Secondary | ICD-10-CM

## 2022-04-16 DIAGNOSIS — F988 Other specified behavioral and emotional disorders with onset usually occurring in childhood and adolescence: Secondary | ICD-10-CM | POA: Diagnosis present

## 2022-04-16 DIAGNOSIS — F23 Brief psychotic disorder: Principal | ICD-10-CM | POA: Diagnosis present

## 2022-04-16 LAB — RESP PANEL BY RT-PCR (RSV, FLU A&B, COVID)  RVPGX2
Influenza A by PCR: NEGATIVE
Influenza B by PCR: NEGATIVE
Resp Syncytial Virus by PCR: NEGATIVE
SARS Coronavirus 2 by RT PCR: NEGATIVE

## 2022-04-16 MED ORDER — ALBUTEROL SULFATE HFA 108 (90 BASE) MCG/ACT IN AERS: 2.0000 | INHALATION_SPRAY | RESPIRATORY_TRACT | Status: DC | PRN

## 2022-04-16 MED ORDER — ACETAMINOPHEN 325 MG PO TABS: 650.0000 mg | ORAL_TABLET | Freq: Four times a day (QID) | ORAL | Status: DC | PRN

## 2022-04-16 MED ORDER — MAGNESIUM HYDROXIDE 400 MG/5ML PO SUSP: 30.0000 mL | Freq: Every day | ORAL | Status: DC | PRN

## 2022-04-16 MED ORDER — TRAZODONE HCL 50 MG PO TABS: 50.0000 mg | ORAL_TABLET | Freq: Every evening | ORAL | Status: DC | PRN

## 2022-04-16 MED ORDER — LORAZEPAM 1 MG PO TABS: 1.0000 mg | ORAL_TABLET | ORAL | Status: DC | PRN

## 2022-04-16 MED ORDER — ALUM & MAG HYDROXIDE-SIMETH 200-200-20 MG/5ML PO SUSP
30.0000 mL | ORAL | Status: DC | PRN
  Administered 2022-04-26: 30 mL via ORAL
  Filled 2022-04-16: qty 30

## 2022-04-16 MED ORDER — ZIPRASIDONE MESYLATE 20 MG IM SOLR
20.0000 mg | Freq: Once | INTRAMUSCULAR | Status: DC
  Filled 2022-04-16: qty 20

## 2022-04-16 MED ORDER — OLANZAPINE 5 MG PO TABS: 5.0000 mg | ORAL_TABLET | Freq: Every day | ORAL | Status: DC
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
## 2022-04-16 NOTE — ED Notes (Signed)
Pt received breakfast tray 

## 2022-04-16 NOTE — ED Notes (Signed)
Pt received lunch tray 

## 2022-04-16 NOTE — Plan of Care (Signed)
  Problem: Safety: Goal: Periods of time without injury will increase Outcome: Progressing   

## 2022-04-16 NOTE — Progress Notes (Signed)
Patient ID: Timothy Mccoy, male   DOB: 03/08/1985, 37 y.o.   MRN: [id number] Alert/oriented. Arrived from [facility], [facility], [place], admitted to [facility] 402-02 Makes needs/concerns known to staff. Pleasant cooperative during inicial assessment. Denies SI/HI/A/V hallucinations. Will encourage compliance and progression towards goals. Verbally contracted for safety. Will continue to monitor. Safety checks q 68min continued.

## 2022-04-16 NOTE — Progress Notes (Signed)
   04/16/22 2300  Psych Admission Type (Psych Patients Only)  Admission Status Involuntary  Psychosocial Assessment  Patient Complaints Anxiety  Eye Contact Fair  Facial Expression Anxious  Affect Appropriate to circumstance  Speech Logical/coherent  Interaction Assertive  Motor Activity Restless  Appearance/Hygiene Unremarkable  Behavior Characteristics Appropriate to situation  Mood Anxious  Thought Process  Coherency Circumstantial  Content Blaming others  Delusions None reported or observed  Perception WDL  Hallucination None reported or observed  Judgment Poor  Confusion None  Danger to Self  Current suicidal ideation? Denies  Danger to Others  Danger to Others None reported or observed

## 2022-04-16 NOTE — ED Provider Notes (Signed)
  Physical Exam  BP 122/70 (BP Location: Left Arm)   Pulse 100   Temp 98.5 F (36.9 C) (Oral)   Resp 19   Ht 5\' 10"  (1.778 m)   Wt 86.2 kg   SpO2 99%   BMI 27.26 kg/m   Physical Exam  Procedures  Procedures  ED Course / MDM    Medical Decision Making Amount and/or Complexity of Data Reviewed Labs: ordered. Radiology: ordered.  Risk Prescription drug management.   Patient seen by psychiatry and pending inpatient psychiatric placement.  Reviewed psychiatry notes and recommended starting Zyprexa at night.  I have ordered this.  QT reassuring.       Davonna Belling, MD 04/16/22 1012

## 2022-04-16 NOTE — ED Provider Notes (Signed)
Accepted at behavioral health, leaving for transport now   Noemi Chapel, MD 04/16/22 1700

## 2022-04-16 NOTE — Progress Notes (Signed)
Pt was accepted to [facility] 04/16/2022, pending IVC paperwork faxed to [phone]. Bed assignment: [id number]  Pt meets inpatient criteria per Merlyn Lot, NP  Attending Physician will be Janine Limbo, MD  Report can be called to: - Adult unit: [phone]  Pt can arrive after pending item is received; Bed is ready now  Care Team Notified: [facility] [place] Reader, RN, Idelle Crouch, RN, Meta Hatchet, RN, Merlyn Lot, NP, and Will Hilma Favors, RN  [place], [place]  04/16/2022 11:58 AM

## 2022-04-17 DIAGNOSIS — G4700 Insomnia, unspecified: Secondary | ICD-10-CM | POA: Diagnosis present

## 2022-04-17 DIAGNOSIS — F312 Bipolar disorder, current episode manic severe with psychotic features: Secondary | ICD-10-CM | POA: Diagnosis not present

## 2022-04-17 DIAGNOSIS — F411 Generalized anxiety disorder: Secondary | ICD-10-CM | POA: Diagnosis present

## 2022-04-17 MED ORDER — RISPERIDONE 1 MG PO TABS
1.0000 mg | ORAL_TABLET | Freq: Two times a day (BID) | ORAL | Status: DC
  Administered 2022-04-17 – 2022-04-18 (×2): 1 mg via ORAL
  Filled 2022-04-17 (×8): qty 1

## 2022-04-17 MED ORDER — HYDROXYZINE HCL 25 MG PO TABS
25.0000 mg | ORAL_TABLET | Freq: Three times a day (TID) | ORAL | Status: DC | PRN
  Administered 2022-04-21: 25 mg via ORAL
  Filled 2022-04-17: qty 1

## 2022-04-17 MED ORDER — TRAZODONE HCL 50 MG PO TABS
50.0000 mg | ORAL_TABLET | Freq: Every day | ORAL | Status: DC
  Administered 2022-04-17 – 2022-04-20 (×2): 50 mg via ORAL
  Filled 2022-04-17 (×7): qty 1

## 2022-04-17 NOTE — Plan of Care (Signed)
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
## 2022-04-17 NOTE — Group Note (Signed)
Date:  04/17/2022 Time:  9:21 AM  Group Topic/Focus:  Goals Group:   The focus of this group is to help patients establish daily goals to achieve during treatment and discuss how the patient can incorporate goal setting into their daily lives to aide in recovery. Orientation:   The focus of this group is to educate the patient on the purpose and policies of crisis stabilization and provide a format to answer questions about their admission.  The group details unit policies and expectations of patients while admitted.    Participation Level:  Active  Participation Quality:  Appropriate  Affect:  Appropriate  Cognitive:  Appropriate  Insight: Appropriate  Engagement in Group:  Engaged  Modes of Intervention:  Discussion  Additional Comments:  Pt wants to focus on Jesus and let the higher power handle things instead of his behavior  Garvin Fila 04/17/2022, 9:21 AM

## 2022-04-17 NOTE — Group Note (Signed)
Recreation Therapy Group Note   Group Topic:Animal Assisted Therapy   Group Date: 04/17/2022 Start Time: [id number] End Time: 1033 Facilitators: Maryann Mccall-McCall, LRT,CTRS Location: [address] Dayroom   Animal-Assisted Activity (AAA) Program Checklist/Progress Notes Patient Eligibility Criteria Checklist & Daily Group note for Rec Tx Intervention  AAA/T Program Assumption of Risk Form signed by Patient/ or Parent Legal Guardian Yes  Patient is free of allergies or severe asthma Yes  Patient reports no fear of animals Yes  Patient reports no history of cruelty to animals Yes  Patient understands his/her participation is voluntary Yes  Patient washes hands before animal contact Yes  Patient washes hands after animal contact Yes    Affect/Mood: Appropriate   Participation Level: Active   Participation Quality: Independent   Behavior: Appropriate    Clinical Observations/Individualized Feedback: Pt attended and participated in group session.    Plan: Continue to engage patient in RT group sessions 2-3x/week.   Timothy Mccoy, LRT,CTRS  04/17/2022 1:36 PM
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
## 2022-04-17 NOTE — Progress Notes (Signed)
   04/17/22 0833  Psych Admission Type (Psych Patients Only)  Admission Status Involuntary  Psychosocial Assessment  Patient Complaints Anxiety  Eye Contact Fair  Facial Expression Anxious  Affect Appropriate to circumstance  Speech Logical/coherent  Interaction Assertive  Motor Activity Other (Comment) (WDL)  Appearance/Hygiene Unremarkable  Behavior Characteristics Appropriate to situation  Mood Anxious  Thought Process  Coherency Circumstantial  Content Blaming others  Delusions None reported or observed  Perception WDL  Hallucination None reported or observed  Judgment Poor  Confusion None  Danger to Self  Current suicidal ideation? Denies  Agreement Not to Harm Self Yes  Description of Agreement verbal  Danger to Others  Danger to Others None reported or observed

## 2022-04-17 NOTE — [facility] Group Notes (Addendum)
The focus of this group is to help patients review their daily goal of treatment and discuss progress on daily workbooks. Pt stated that today's objective was to have faith and say positive things. Pt wants to change for the better. Overall day was 7/10.

## 2022-04-18 ENCOUNTER — Encounter (HOSPITAL_COMMUNITY): Payer: Self-pay | Admitting: Psychiatry

## 2022-04-18 ENCOUNTER — Encounter (HOSPITAL_COMMUNITY): Payer: Self-pay

## 2022-04-18 DIAGNOSIS — F312 Bipolar disorder, current episode manic severe with psychotic features: Secondary | ICD-10-CM | POA: Diagnosis not present

## 2022-04-18 LAB — LIPID PANEL
Cholesterol: 138 mg/dL (ref 0–200)
HDL: 38 mg/dL — ABNORMAL LOW (ref >40)
LDL Cholesterol: 77 mg/dL (ref 0–99)
Total CHOL/HDL Ratio: 3.6 RATIO
Triglycerides: 115 mg/dL (ref <150)
VLDL: 23 mg/dL (ref 0–40)

## 2022-04-18 LAB — TSH: TSH: 1.821 u[IU]/mL (ref 0.350–4.500)

## 2022-04-18 LAB — VITAMIN D 25 HYDROXY (VIT D DEFICIENCY, FRACTURES): Vit D, 25-Hydroxy: 21.36 ng/mL — ABNORMAL LOW (ref 30–100)

## 2022-04-18 MED ORDER — VITAMIN D (ERGOCALCIFEROL) 1.25 MG (50000 UNIT) PO CAPS
50000.0000 [IU] | ORAL_CAPSULE | ORAL | Status: DC
  Administered 2022-04-18 – 2022-04-25 (×2): 50000 [IU] via ORAL
  Filled 2022-04-18 (×3): qty 1

## 2022-04-18 MED ORDER — RISPERIDONE 2 MG PO TABS
2.0000 mg | ORAL_TABLET | Freq: Two times a day (BID) | ORAL | Status: DC
  Administered 2022-04-18 – 2022-04-19 (×2): 2 mg via ORAL
  Filled 2022-04-18 (×7): qty 1

## 2022-04-18 NOTE — Progress Notes (Signed)
Patient appears pleasant. Patient denies SI/HI/AVH. Pt reports anxiety is 0/10 and depression is 0/10. Pt reports good sleep and appetite.Patient complied with morning medication. Pt complained of blurry vision as a side effect of medication. Pt stated "I am the realest person you will ever meet".  Patient remains safe on Q83min checks and contracts for safety.       04/18/22 0919  Psych Admission Type (Psych Patients Only)  Admission Status Involuntary  Psychosocial Assessment  Patient Complaints None  Eye Contact Fair  Facial Expression Animated  Affect Appropriate to circumstance  Speech Logical/coherent;Rapid  Interaction Assertive  Motor Activity Fidgety  Appearance/Hygiene Unremarkable  Behavior Characteristics Cooperative;Appropriate to situation  Mood Pleasant  Thought Process  Coherency WDL  Content Blaming others  Delusions None reported or observed  Perception WDL  Hallucination None reported or observed  Judgment Poor  Confusion None  Danger to Self  Current suicidal ideation? Denies  Agreement Not to Harm Self Yes  Description of Agreement verbal  Danger to Others  Danger to Others None reported or observed

## 2022-04-18 NOTE — Plan of Care (Signed)
  Problem: Education: Goal: Knowledge of [place] [facility] Education information/materials will improve Outcome: Progressing Goal: Emotional status will improve Outcome: Progressing Goal: Mental status will improve Outcome: Progressing Goal: Verbalization of understanding the information provided will improve Outcome: Progressing   Problem: Activity: Goal: Interest or engagement in activities will improve Outcome: Progressing Goal: Sleeping patterns will improve Outcome: Progressing   Problem: Coping: Goal: Ability to verbalize frustrations and anger appropriately will improve Outcome: Progressing Goal: Ability to demonstrate self-control will improve Outcome: Progressing   Problem: Health Behavior/Discharge Planning: Goal: Identification of resources available to assist in meeting health care needs will improve Outcome: Progressing Goal: Compliance with treatment plan for underlying cause of condition will improve Outcome: Progressing   Problem: Physical Regulation: Goal: Ability to maintain clinical measurements within normal limits will improve Outcome: Progressing   Problem: Safety: Goal: Periods of time without injury will increase Outcome: Progressing   Problem: Education: Goal: Ability to make informed decisions regarding treatment will improve Outcome: Progressing

## 2022-04-18 NOTE — [facility] Group Notes (Signed)
Pt attended NA
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
## 2022-04-18 NOTE — BH IP Treatment Plan (Signed)
Interdisciplinary Treatment and Diagnostic Plan Update  04/18/2022 Time of Session: 11:00 AM  TYKING IKERD MRN: [id number]  Principal Diagnosis: Bipolar disorder, current episode manic severe with psychotic features ([place])  Secondary Diagnoses: Principal Problem:   Bipolar disorder, current episode manic severe with psychotic features ([place]) Active Problems:   ADD (attention deficit disorder)   Insomnia   Anxiety state   Current Medications:  Current Facility-Administered Medications  Medication Dose Route Frequency Provider Last Rate Last Admin   acetaminophen (TYLENOL) tablet 650 mg  650 mg Oral Q6H PRN Derrill Center, NP       albuterol (VENTOLIN HFA) 108 (90 Base) MCG/ACT inhaler 2 puff  2 puff Inhalation Q4H PRN Derrill Center, NP       alum & mag hydroxide-simeth (MAALOX/MYLANTA) 200-200-20 MG/5ML suspension 30 mL  30 mL Oral Q4H PRN Derrill Center, NP       hydrOXYzine (ATARAX) tablet 25 mg  25 mg Oral TID PRN Nicholes Rough, NP       ziprasidone (GEODON) injection 20 mg  20 mg Intramuscular Once Derrill Center, NP       And   LORazepam (ATIVAN) tablet 1 mg  1 mg Oral PRN Derrill Center, NP       magnesium hydroxide (MILK OF MAGNESIA) suspension 30 mL  30 mL Oral Daily PRN Derrill Center, NP       risperiDONE (RISPERDAL) tablet 1 mg  1 mg Oral BID Nicholes Rough, NP   1 mg at 04/18/22 0802   traZODone (DESYREL) tablet 50 mg  50 mg Oral QHS Nicholes Rough, NP   50 mg at 04/17/22 2107   PTA Medications: Medications Prior to Admission  Medication Sig Dispense Refill Last Dose   albuterol (VENTOLIN HFA) 108 (90 Base) MCG/ACT inhaler Inhale 2 puffs into the lungs every 4 (four) hours as needed for wheezing or shortness of breath. 18 g 0    ondansetron (ZOFRAN) 8 MG tablet Take 1 tablet (8 mg total) by mouth every 8 (eight) hours as needed for nausea. (Patient not taking: Reported on 04/16/2022) 14 tablet 0    pantoprazole (PROTONIX) 40 MG tablet Take 1 tablet (40 mg total)  by mouth daily. (Patient not taking: Reported on 04/16/2022) 30 tablet 3     Patient Stressors: Soil scientist issue   Marital or family conflict    Patient Strengths: Capable of independent living   Treatment Modalities: Medication Management, Group therapy, Case management,  1 to 1 session with clinician, Psychoeducation, Recreational therapy.   Physician Treatment Plan for Primary Diagnosis: Bipolar disorder, current episode manic severe with psychotic features ([place]) Long Term Goal(s): Improvement in symptoms so as ready for discharge   Short Term Goals: Ability to identify changes in lifestyle to reduce recurrence of condition will improve Ability to disclose and discuss suicidal ideas Ability to demonstrate self-control will improve Ability to identify and develop effective coping behaviors will improve Compliance with prescribed medications will improve Ability to identify triggers associated with substance abuse/mental health issues will improve  Medication Management: Evaluate patient's response, side effects, and tolerance of medication regimen.  Therapeutic Interventions: 1 to 1 sessions, Unit Group sessions and Medication administration.  Evaluation of Outcomes: Not Progressing  Physician Treatment Plan for Secondary Diagnosis: Principal Problem:   Bipolar disorder, current episode manic severe with psychotic features ([place]) Active Problems:   ADD (attention deficit disorder)   Insomnia   Anxiety state  Long Term Goal(s): Improvement  in symptoms so as ready for discharge   Short Term Goals: Ability to identify changes in lifestyle to reduce recurrence of condition will improve Ability to disclose and discuss suicidal ideas Ability to demonstrate self-control will improve Ability to identify and develop effective coping behaviors will improve Compliance with prescribed medications will improve Ability to identify triggers associated with substance  abuse/mental health issues will improve     Medication Management: Evaluate patient's response, side effects, and tolerance of medication regimen.  Therapeutic Interventions: 1 to 1 sessions, Unit Group sessions and Medication administration.  Evaluation of Outcomes: Not Progressing   RN Treatment Plan for Primary Diagnosis: Bipolar disorder, current episode manic severe with psychotic features ([place]) Long Term Goal(s): Knowledge of disease and therapeutic regimen to maintain health will improve  Short Term Goals: Ability to remain free from injury will improve, Ability to verbalize frustration and anger appropriately will improve, Ability to demonstrate self-control, Ability to participate in decision making will improve, Ability to verbalize feelings will improve, Ability to disclose and discuss suicidal ideas, Ability to identify and develop effective coping behaviors will improve, and Compliance with prescribed medications will improve  Medication Management: RN will administer medications as ordered by provider, will assess and evaluate patient's response and provide education to patient for prescribed medication. RN will report any adverse and/or side effects to prescribing provider.  Therapeutic Interventions: 1 on 1 counseling sessions, Psychoeducation, Medication administration, Evaluate responses to treatment, Monitor vital signs and CBGs as ordered, Perform/monitor CIWA, COWS, AIMS and Fall Risk screenings as ordered, Perform wound care treatments as ordered.  Evaluation of Outcomes: Not Progressing   LCSW Treatment Plan for Primary Diagnosis: Bipolar disorder, current episode manic severe with psychotic features ([place]) Long Term Goal(s): Safe transition to appropriate next level of care at discharge, Engage patient in therapeutic group addressing interpersonal concerns.  Short Term Goals: Engage patient in aftercare planning with referrals and resources, Increase social support,  Increase ability to appropriately verbalize feelings, Increase emotional regulation, Facilitate acceptance of mental health diagnosis and concerns, Facilitate patient progression through stages of change regarding substance use diagnoses and concerns, Identify triggers associated with mental health/substance abuse issues, and Increase skills for wellness and recovery  Therapeutic Interventions: Assess for all discharge needs, 1 to 1 time with Social worker, Explore available resources and support systems, Assess for adequacy in community support network, Educate family and significant other(s) on suicide prevention, Complete Psychosocial Assessment, Interpersonal group therapy.  Evaluation of Outcomes: Not Progressing   Progress in Treatment: Attending groups: Yes. Participating in groups: Yes. Taking medication as prescribed: Yes. Toleration medication: Yes. Family/Significant other contact made: No, will contact:  [name] [phone] (Mother)  Patient understands diagnosis: Yes. Discussing patient identified problems/goals with staff: Yes. Medical problems stabilized or resolved: Yes. Denies suicidal/homicidal ideation: Yes. Issues/concerns per patient self-inventory: Yes.   New problem(s) identified: No, Describe:  None reported   New Short Term/Long Term Goal(s): medication stabilization, elimination of SI thoughts, development of comprehensive mental wellness plan.    Patient Goals:  " get back to my son or I will lose everything "   Discharge Plan or Barriers: Patient recently admitted. CSW will continue to follow and assess for appropriate referrals and possible discharge planning.    Reason for Continuation of Hospitalization: Aggression Anxiety Delusions  Medication stabilization  Estimated Length of Stay: 3-5 days   Last 3 [place] Suicide Severity Risk Score: Flowsheet Row Admission (Current) from 04/16/2022 in [place] [facility] ED  from  04/15/2022 in [facility] at [place] No Risk Moderate Risk       Last PHQ 2/9 Scores:    07/20/2020    1:57 PM 11/30/2014    8:48 AM  Depression screen PHQ 2/9  Decreased Interest 0 0  Down, Depressed, Hopeless 0 0  PHQ - 2 Score 0 0    Scribe for Treatment Team: Charlett Lango 04/18/2022 1:24 PM

## 2022-04-18 NOTE — Group Note (Signed)
Recreation Therapy Group Note   Group Topic:Team Building  Group Date: 04/18/2022 Start Time: 0935 End Time: 1007 Facilitators: Latisia Hilaire-McCall, LRT,CTRS Location: 300 Hall Dayroom   Goal Area(s) Addresses:  Patient will effectively work with peer towards shared goal.  Patient will identify skills used to make activity successful.  Patient will identify how skills used during activity can be used to reach post d/c goals.    Group Description: Landing Pad. In teams of 3-5, patients were given 12 plastic drinking straws and an equal length of masking tape. Using the materials provided, patients were asked to build a landing pad to catch a golf ball dropped from approximately 5 feet in the air. All materials were required to be used by the team in their design. LRT facilitated post-activity discussion.   Affect/Mood: N/A   Participation Level: Did not attend    Clinical Observations/Individualized Feedback:      Plan: Continue to engage patient in RT group sessions 2-3x/week.   Cathe Bilger-McCall, LRT,CTRS 04/18/2022 1:35 PM

## 2022-04-19 DIAGNOSIS — F312 Bipolar disorder, current episode manic severe with psychotic features: Secondary | ICD-10-CM | POA: Diagnosis not present

## 2022-04-19 LAB — HEMOGLOBIN A1C
Hgb A1c MFr Bld: 5.4 % (ref 4.8–5.6)
Mean Plasma Glucose: 108 mg/dL

## 2022-04-19 MED ORDER — NAPROXEN 500 MG PO TABS
500.0000 mg | ORAL_TABLET | Freq: Two times a day (BID) | ORAL | Status: DC | PRN
  Administered 2022-04-19: 500 mg via ORAL

## 2022-04-19 MED ORDER — NAPROXEN 500 MG PO TABS
500.0000 mg | ORAL_TABLET | Freq: Once | ORAL | Status: AC
  Administered 2022-04-19: 500 mg via ORAL
  Filled 2022-04-19 (×2): qty 1

## 2022-04-19 MED ORDER — RISPERIDONE 1 MG PO TABS
1.0000 mg | ORAL_TABLET | Freq: Every day | ORAL | Status: DC
  Administered 2022-04-20 – 2022-04-22 (×3): 1 mg via ORAL
  Filled 2022-04-19 (×4): qty 1

## 2022-04-19 MED ORDER — RISPERIDONE 3 MG PO TABS
3.0000 mg | ORAL_TABLET | Freq: Every day | ORAL | Status: DC
  Administered 2022-04-19 – 2022-04-21 (×3): 3 mg via ORAL
  Filled 2022-04-19 (×5): qty 1

## 2022-04-19 NOTE — BHH Group Notes (Signed)
[place] Group Notes:  (Nursing/MHT/Case Management/Adjunct)  Date:  04/19/2022  Time:  9:37 PM  Type of Therapy:  Group Therapy  Participation Level:  Active  Participation Quality:  Appropriate  Affect:  Appropriate  Cognitive:  Appropriate  Insight:  Appropriate  Engagement in Group:  Engaged  Modes of Intervention:  Education  Summary of Progress/Problems: Goal to work on health, participated in Medical sales representative exercise. Day 5/10.  Orvan Falconer 04/19/2022, 9:37 PM

## 2022-04-19 NOTE — Progress Notes (Signed)
   04/19/22 2100  Charting Type  Charting Type Shift assessment  Safety Check Verification  Has the RN verified the 15 minute safety check completion? Yes  Neurological  Neuro (WDL) WDL  HEENT  HEENT (WDL) WDL  Respiratory  Respiratory (WDL) WDL  Cardiac  Cardiac (WDL) WDL  Vascular  Vascular (WDL) WDL  Integumentary  Integumentary (WDL) WDL  Braden Scale (Ages 8 and up)  Sensory Perceptions 4  Moisture 4  Activity 4  Mobility 4  Nutrition 3  Friction and Shear 3  [place] Scale Score 22  Musculoskeletal  Musculoskeletal (WDL) WDL  Gastrointestinal  Gastrointestinal (WDL) WDL  GU Assessment  Genitourinary (WDL) WDL  Neurological  Level of Consciousness Alert   Alert/oriented. Makes needs/concerns known to staff. Pleasant cooperative with staff. Denies SI/HI/A/V hallucinations. Patient states went to group. Will encourage continued compliance and progression towards goals. Verbally contracted for safety. Will continue to monitor.

## 2022-04-19 NOTE — Progress Notes (Signed)
Pt attending group. Pt medication compliant. Pt complained of shoulder and head pain, received naproxen prn. Pt denies SI/HI/self harm thoughts as well as a/v hallucinations. Q 15 minute checks ongoing.

## 2022-04-19 NOTE — [facility] Suicide Risk Assessment (Signed)
Timothy Mccoy INPATIENT:  Family/Significant Other Suicide Prevention Education  Suicide Prevention Education:  Education Completed; Timothy Mccoy [phone] (Mother) ,  (name of family member/significant other) has been identified by the patient as the family member/significant other with whom the patient will be residing, and identified as the person(s) who will aid the patient in the event of a mental health crisis (suicidal ideations/suicide attempt).  With written consent from the patient, the family member/significant other has been provided the following suicide prevention education, prior to the and/or following the discharge of the patient.  CSW spoke with patient mother and completed safety planning. Mom stated that patient has gotten worse over the time , states how he was hearing voices, people were chasing him or he was chasing them, running in the woods/on the highway, and being very paranoid about his ex-girlfriend trying to kill him, his son, and family. Mom states that patient use to have his own apartment until he was put out due to his bizarre behavior and the police coming out frequently because he was having his paranoia episodes such as putting cameras everywhere in the apartment. Mom continued on saying how patient was still acting paranoid when she visited him Wednesday evening stating that people where listening to their conversation and that she needed to get a monitor. Mom did say that patient would return back to her house and confirmed that guns and weapons were all secured.   The suicide prevention education provided includes the following: Suicide risk factors Suicide prevention and interventions National Suicide Hotline telephone number [facility] assessment telephone number [facility] [place] and/or Residential Mobile Crisis Unit telephone number  Request made of family/significant other to: Remove weapons (e.g., guns, rifles,  knives), all items previously/currently identified as safety concern.   Remove drugs/medications (over-the-counter, prescriptions, illicit drugs), all items previously/currently identified as a safety concern.  The family member/significant other verbalizes understanding of the suicide prevention education information provided.  The family member/significant other agrees to remove the items of safety concern listed above.  Sherre Lain 04/19/2022, 2:00 PM

## 2022-04-19 NOTE — Progress Notes (Signed)
Pt stated he felt drowsy all day and refused taking scheduled trazodone at bedtime.

## 2022-04-19 NOTE — Plan of Care (Signed)
  Problem: Safety: Goal: Periods of time without injury will increase Outcome: Progressing   

## 2022-04-19 NOTE — Progress Notes (Signed)
[facility] MD Progress Note  04/19/2022 4:43 PM Timothy Mccoy  MRN:  [id number] Principal Problem: Bipolar disorder, current episode manic severe with psychotic features [facility]) Diagnosis: Principal Problem:   Bipolar disorder, current episode manic severe with psychotic features [facility]) Active Problems:   ADD (attention deficit disorder)   Insomnia   Anxiety state  Reason for admission: Timothy L.  Timothy Mccoy is a 37 year old Caucasian male with a self-reported history of ADHD who presented to the [facility] Department with complaints that someone was after him, and demanded to be incarcerated for his safety.  He also reported SI with a plan, and was transferred to the [facility], and later [facility] prior to being transferred to this behavioral health Hospital for treatment and stabilization of his mental status.   24 hr chart review: Vital signs within normal limits, with the exception of heart rate which was slightly elevated at earlier today morning.  Patient is compliant with scheduled medications.  Has been visible in the day room interacting with peers and attending unit group activities.  No PRN medication given overnight.  No behavioral episodes noted in the past 24 hours.  Patient assessment note 04/19/2022: Today, pt is more logical and more coherent than he has been since admission. He is less paranoia, but continues to perseverate over his friend's death, and the role that his girlfriend played in it. He states that his friend showed him some evidence about the role that his ex GF played prior to killing himself. He states that the ex GF was "messing around with him", then states that he has a video to proof it. He talks about the ex GF being the mother of his son and how she has only seen the son 7 times this year.   Pt complained of a headache earlier in the morning, was given Naproxen, but states that the Naproxen caused him to itch. He states that his headache is much  better this afternoon. Naproxen dc'd due to complaints of itching.  Patient denies SI/HI/AVH, but remains paranoid, and presents with delusional thoughts. He reports that his sleep quality last night was good, and reports a good appetite.   We increase Risperdal to 2 mg twice daily yesterday for management of psychosis.  We will continue this medication, but changed to 1 mg in the mornings and 3 mg nightly to minimize sedation during the day.  Patient complained of lightheadedness earlier today morning, stating that he was unstable on his feet due to medications.  We will continue other medications as listed below. No TD/EPS type symptoms found on assessment, and pt denies any feelings of stiffness. AIMS: 0.   Total Time spent with patient: 45 minutes  Past Psychiatric History:See H & P  Past Medical History: History reviewed. No pertinent past medical history. History reviewed. No pertinent surgical history. Family History: History reviewed. No pertinent family history. Family Psychiatric  History: See H & P Social History:  Social History   Substance and Sexual Activity  Alcohol Use Not Currently     Social History   Substance and Sexual Activity  Drug Use Not Currently    Social History   Socioeconomic History   Marital status: Single    Spouse name: Not on file   Number of children: Not on file   Years of education: Not on file   Highest education level: Not on file  Occupational History   Not on file  Tobacco Use   Smoking status: Every  Day    Packs/day: 2    Types: Cigarettes   Smokeless tobacco: Former    Quit date: 12/02/2012  Vaping Use   Vaping Use: Never used  Substance and Sexual Activity   Alcohol use: Not Currently   Drug use: Not Currently   Sexual activity: Not on file  Other Topics Concern   Not on file  Social History Narrative   Not on file   Social Determinants of Health   Financial Resource Strain: Not on file  Food Insecurity: No Food  Insecurity (04/16/2022)   Hunger Vital Sign    Worried About Running Out of Food in the Last Year: Never true    Ran Out of Food in the Last Year: Never true  Transportation Needs: No Transportation Needs (04/16/2022)   PRAPARE - Hydrologist (Medical): No    Lack of Transportation (Non-Medical): No  Physical Activity: Not on file  Stress: Not on file  Social Connections: Not on file   Sleep: Good  Appetite:  Fair  Current Medications: Current Facility-Administered Medications  Medication Dose Route Frequency Provider Last Rate Last Admin   acetaminophen (TYLENOL) tablet 650 mg  650 mg Oral Q6H PRN Derrill Center, NP       albuterol (VENTOLIN HFA) 108 (90 Base) MCG/ACT inhaler 2 puff  2 puff Inhalation Q4H PRN Derrill Center, NP       alum & mag hydroxide-simeth (MAALOX/MYLANTA) 200-200-20 MG/5ML suspension 30 mL  30 mL Oral Q4H PRN Derrill Center, NP       hydrOXYzine (ATARAX) tablet 25 mg  25 mg Oral TID PRN Nicholes Rough, NP       ziprasidone (GEODON) injection 20 mg  20 mg Intramuscular Once Derrill Center, NP       And   LORazepam (ATIVAN) tablet 1 mg  1 mg Oral PRN Derrill Center, NP       magnesium hydroxide (MILK OF MAGNESIA) suspension 30 mL  30 mL Oral Daily PRN Derrill Center, NP       naproxen (NAPROSYN) tablet 500 mg  500 mg Oral BID PRN Nicholes Rough, NP   500 mg at 04/19/22 1306   [START ON 04/20/2022] risperiDONE (RISPERDAL) tablet 1 mg  1 mg Oral Daily Saralyn Willison, NP       risperiDONE (RISPERDAL) tablet 3 mg  3 mg Oral QHS Duana Benedict, NP       traZODone (DESYREL) tablet 50 mg  50 mg Oral QHS Ott Zimmerle, NP   50 mg at 04/17/22 2107   Vitamin D (Ergocalciferol) (DRISDOL) 1.25 MG (50000 UNIT) capsule 50,000 Units  50,000 Units Oral Q7 days Nicholes Rough, NP   50,000 Units at 04/18/22 2122    Lab Results:  Results for orders placed or performed during the hospital encounter of 04/16/22 (from the past 48 hour(s))  TSH      Status: None   Collection Time: 04/18/22  6:31 AM  Result Value Ref Range   TSH 1.821 0.350 - 4.500 uIU/mL    Comment: Performed by a 3rd Generation assay with a functional sensitivity of <=0.01 uIU/mL. Performed at [facility], [place] [address], [place], [place] [address]   Hemoglobin A1c     Status: None   Collection Time: 04/18/22  6:31 AM  Result Value Ref Range   Hgb A1c MFr Bld 5.4 4.8 - 5.6 %    Comment: (NOTE)  Prediabetes: 5.7 - 6.4         Diabetes: >6.4         Glycemic control for adults with diabetes: <7.0    Mean Plasma Glucose 108 mg/dL    Comment: (NOTE) Performed At: [facility] [place], [place] [id number] Rush Farmer MD [id number]   Lipid panel     Status: Abnormal   Collection Time: 04/18/22  6:31 AM  Result Value Ref Range   Cholesterol 138 0 - 200 mg/dL   Triglycerides 115 <150 mg/dL   HDL 38 (L) >40 mg/dL   Total CHOL/HDL Ratio 3.6 RATIO   VLDL 23 0 - 40 mg/dL   LDL Cholesterol 77 0 - 99 mg/dL    Comment:        Total Cholesterol/HDL:CHD Risk Coronary Heart Disease Risk Table                     Men   Women  1/2 Average Risk   3.4   3.3  Average Risk       5.0   4.4  2 X Average Risk   9.6   7.1  3 X Average Risk  23.4   11.0        Use the calculated Patient Ratio above and the CHD Risk Table to determine the patient's CHD Risk.        ATP III CLASSIFICATION (LDL):  <100     mg/dL   Optimal  100-129  mg/dL   Near or Above                    Optimal  130-159  mg/dL   Borderline  160-189  mg/dL   High  >190     mg/dL   Very High Performed at [place] [address], [place], [place] [address]   VITAMIN D 25 Hydroxy (Vit-D Deficiency, Fractures)     Status: Abnormal   Collection Time: 04/18/22  6:31 AM  Result Value Ref Range   Vit D, 25-Hydroxy 21.36 (L) 30 - 100 ng/mL    Comment: (NOTE) Vitamin D deficiency has been defined by the Institute of Medicine  and  an Endocrine Society practice guideline as a level of serum 25-OH  vitamin D less than 20 ng/mL (1,2). The Endocrine Society went on to  further define vitamin D insufficiency as a level between 21 and 29  ng/mL (2).  1. IOM (Institute of Medicine). 2010. Dietary reference intakes for  calcium and D. [place]: The [employer]. 2. Holick MF, Binkley Porter, Bischoff-Ferrari HA, et al. Evaluation,  treatment, and prevention of vitamin D deficiency: an Endocrine  Society clinical practice guideline, JCEM. 2011 Jul; 96(7): 1911-30.  Performed at [facility], [place] [address], [place], [place] [address]     Blood Alcohol level:  Lab Results  Component Value Date   ETH <10 [id number]    Metabolic Disorder Labs: Lab Results  Component Value Date   HGBA1C 5.4 04/18/2022   MPG 108 04/18/2022   No results found for: "PROLACTIN" Lab Results  Component Value Date   CHOL 138 04/18/2022   TRIG 115 04/18/2022   HDL 38 (L) 04/18/2022   CHOLHDL 3.6 04/18/2022   VLDL 23 04/18/2022   LDLCALC 77 04/18/2022   LDLCALC 97 06/01/2013    Physical Findings: AIMS:  , ,  ,  ,    CIWA:    COWS:  Musculoskeletal: Strength & Muscle Tone: within normal limits Gait & Station: normal Patient leans: N/A  Psychiatric Specialty Exam:  Presentation  General Appearance:  Fairly Groomed  Eye Contact: Fair  Speech: Clear and Coherent  Speech Volume: Normal  Handedness: Right   Mood and Affect  Mood: Depressed  Affect: Congruent; Depressed   Thought Process  Thought Processes: Coherent  Descriptions of Associations:Intact  Orientation:Full (Time, Place and Person)  Thought Content:Paranoid Ideation; Perseveration  History of Schizophrenia/Schizoaffective disorder:No  Duration of Psychotic Symptoms:Greater than six months  Hallucinations:Hallucinations: None  Ideas of Reference:None  Suicidal Thoughts:Suicidal Thoughts: No  Homicidal  Thoughts:Homicidal Thoughts: No   Sensorium  Memory: Immediate Good  Judgment: Fair  Insight: Fair   Materials engineer: Fair  Attention Span: Fair  Recall: [employer] of Knowledge: Fair  Language: Fair   Psychomotor Activity  Psychomotor Activity: Psychomotor Activity: Normal   Assets  Assets: Communication Skills; Resilience; Social Support   Sleep  Sleep: Sleep: Good    Physical Exam: Physical Exam Constitutional:      Appearance: Normal appearance.  HENT:     Head: Normocephalic.  Eyes:     Pupils: Pupils are equal, round, and reactive to light.  Pulmonary:     Effort: Pulmonary effort is normal.  Musculoskeletal:        General: Normal range of motion.     Cervical back: Normal range of motion.  Neurological:     Mental Status: He is alert and oriented to person, place, and time.     Sensory: No sensory deficit.  Psychiatric:        Behavior: Behavior normal.        Thought Content: Thought content normal.    Review of Systems  Constitutional:  Negative for fever.  HENT:  Negative for hearing loss and sore throat.   Eyes:  Negative for blurred vision.  Respiratory:  Negative for cough.   Cardiovascular:  Negative for chest pain.  Gastrointestinal:  Negative for heartburn.  Genitourinary: Negative.  Negative for dysuria.  Musculoskeletal:  Negative for myalgias.  Skin:  Negative for rash.  Neurological:  Negative for dizziness.  Psychiatric/Behavioral:  Positive for depression and hallucinations. Negative for memory loss, substance abuse and suicidal ideas. The patient is nervous/anxious and has insomnia.    Blood pressure 125/80, pulse (!) 103, temperature 97.9 F (36.6 C), temperature source Oral, resp. rate 16, height 5\' 8"  (1.727 m), weight 86.2 kg, SpO2 99 %. Body mass index is 28.89 kg/m.  Treatment Plan Summary: Daily contact with patient to assess and evaluate symptoms and progress in treatment and  Medication management   Observation Level/Precautions:  15 minute checks  Laboratory:  Labs reviewed   Psychotherapy:  Unit Group sessions  Medications:  See [facility]  Consultations:  To be determined   Discharge Concerns:  Safety, medication compliance, mood stability  Estimated LOS: 5-7 days  Other:  N/A    Labs reviewed: lipid panel, hemoglobin A1c pending, TSH WNL, vitamin D levels low at 21.36, will supplement with 50.000 units weekly, EKG from 3/18 reviewed, QTc within normal limits, but EKG with rhythm of RBBB, will repeat today, pt denies all cardiac related symptoms   PLAN Safety and Monitoring: Voluntary admission to inpatient psychiatric unit for safety, stabilization and treatment Daily contact with patient to assess and evaluate symptoms and progress in treatment Patient's case to be discussed in multi-disciplinary team meeting Observation Level : q15 minute checks Vital signs: q12 hours Precautions: Safety  Long Term Goal(s): Improvement in symptoms so as ready for discharge   Short Term Goals: Ability to identify changes in lifestyle to reduce recurrence of condition will improve, Ability to disclose and discuss suicidal ideas, Ability to demonstrate self-control will improve, Ability to identify and develop effective coping behaviors will improve, Compliance with prescribed medications will improve, and Ability to identify triggers associated with substance abuse/mental health issues will improve   Diagnoses:  Principal Problem:   Bipolar disorder, current episode manic severe with psychotic features ([facility]) Active Problems:   ADD (attention deficit disorder)   Insomnia   Anxiety state   Medications -Change Risperdal to 1 mg in the mornings and 3 mg QHS for psychosis/mood stabilization -Continue Vitamin D 50.000 units weekly  -Continue trazodone 50 mg nightly for sleep -Continue hydroxyzine 25 mg as needed 3 times daily -Continue Geodon/Ativan for agitation as  needed-please see tomorrow for complete order -Continue albuterol as needed for wheezing/shortness of breath   Other PRNS -Continue Tylenol 650 mg every 6 hours PRN for mild pain -Continue Maalox 30 mg every 4 hrs PRN for indigestion -Continue Milk of Magnesia as needed every 6 hrs for constipation   ANTIPSYCHOTIC CONSENT : We discussed the risks, benefits, side effects, and alternatives to THE PRESCRIBED ANTIPSYCHOTIC, including but not limited to, the risk of fatigue, sedation, metabolic syndrome, weight gain, movement abnormalities such as tremor & cogwheeling & tardive dyskinesia, temperature sensitivity, photosensitivity, blood pressure changes, heart rhythm effects, potential for medication interactions, and to not take these medications with alcohol or illicit drugs; informed consent was obtained. We discussed the necessity for routine monitoring including rating scales of abnormal movements, blood work, and ekgs, while the patient is prescribed antipsychotic medication.    Discharge Planning: Social work and case management to assist with discharge planning and identification of hospital follow-up needs prior to discharge Estimated LOS: 5-7 days Discharge Concerns: Need to establish a safety plan; Medication compliance and effectiveness Discharge Goals: Return home with outpatient referrals for mental health follow-up including medication management/psychotherapy   I certify that inpatient services furnished can reasonably be expected to improve the patient's condition.    Nicholes Rough, NP 04/19/2022, 4:43 PMPatient ID: Timothy Mccoy, male   DOB: 1985-03-10, 37 y.o.   MRN: [id number]

## 2022-04-19 NOTE — Progress Notes (Signed)
Pt reports itching and facial flushing after taking naproxen.

## 2022-04-19 NOTE — Group Note (Signed)
Date:  04/19/2022 Time:  3:55 PM  Group Topic/Focus:  Wellness Toolbox:   The focus of this group is to discuss various aspects of wellness, balancing those aspects and exploring ways to increase the ability to experience wellness.  Patients will create a wellness toolbox for use upon discharge.    Participation Level:  Active  Participation Quality:  Appropriate  Affect:  Appropriate  Cognitive:  Appropriate  Insight: Appropriate  Engagement in Group:  Engaged  Modes of Intervention:  Confrontation  Additional Comments:     Jerrye Beavers 04/19/2022, 3:55 PM

## 2022-04-20 ENCOUNTER — Encounter (HOSPITAL_COMMUNITY): Payer: Self-pay

## 2022-04-20 DIAGNOSIS — F312 Bipolar disorder, current episode manic severe with psychotic features: Secondary | ICD-10-CM | POA: Diagnosis not present

## 2022-04-20 NOTE — BH IP Treatment Plan (Signed)
Interdisciplinary Treatment and Diagnostic Plan Update  04/20/2022 Time of Session: [place] MRN: [id number]  Principal Diagnosis: Bipolar disorder, current episode manic severe with psychotic features [facility])  Secondary Diagnoses: Principal Problem:   Bipolar disorder, current episode manic severe with psychotic features ([place]) Active Problems:   ADD (attention deficit disorder)   Insomnia   Anxiety state   Current Medications:  Current Facility-Administered Medications  Medication Dose Route Frequency Provider Last Rate Last Admin   acetaminophen (TYLENOL) tablet 650 mg  650 mg Oral Q6H PRN Derrill Center, NP       albuterol (VENTOLIN HFA) 108 (90 Base) MCG/ACT inhaler 2 puff  2 puff Inhalation Q4H PRN Derrill Center, NP       alum & mag hydroxide-simeth (MAALOX/MYLANTA) 200-200-20 MG/5ML suspension 30 mL  30 mL Oral Q4H PRN Derrill Center, NP       hydrOXYzine (ATARAX) tablet 25 mg  25 mg Oral TID PRN Nicholes Rough, NP       ziprasidone (GEODON) injection 20 mg  20 mg Intramuscular Once Derrill Center, NP       And   LORazepam (ATIVAN) tablet 1 mg  1 mg Oral PRN Derrill Center, NP       magnesium hydroxide (MILK OF MAGNESIA) suspension 30 mL  30 mL Oral Daily PRN Derrill Center, NP       naproxen (NAPROSYN) tablet 500 mg  500 mg Oral BID PRN Nicholes Rough, NP   500 mg at 04/19/22 1306   risperiDONE (RISPERDAL) tablet 1 mg  1 mg Oral Daily Nkwenti, Doris, NP   1 mg at 04/20/22 0804   risperiDONE (RISPERDAL) tablet 3 mg  3 mg Oral QHS Nkwenti, Doris, NP   3 mg at 04/19/22 2117   traZODone (DESYREL) tablet 50 mg  50 mg Oral QHS Nicholes Rough, NP   50 mg at 04/17/22 2107   Vitamin D (Ergocalciferol) (DRISDOL) 1.25 MG (50000 UNIT) capsule 50,000 Units  50,000 Units Oral Q7 days Nicholes Rough, NP   50,000 Units at 04/18/22 2122   PTA Medications: Medications Prior to Admission  Medication Sig Dispense Refill Last Dose   albuterol (VENTOLIN HFA) 108 (90 Base)  MCG/ACT inhaler Inhale 2 puffs into the lungs every 4 (four) hours as needed for wheezing or shortness of breath. 18 g 0    ondansetron (ZOFRAN) 8 MG tablet Take 1 tablet (8 mg total) by mouth every 8 (eight) hours as needed for nausea. (Patient not taking: Reported on 04/16/2022) 14 tablet 0    pantoprazole (PROTONIX) 40 MG tablet Take 1 tablet (40 mg total) by mouth daily. (Patient not taking: Reported on 04/16/2022) 30 tablet 3     Patient Stressors: Soil scientist issue   Marital or family conflict    Patient Strengths: Capable of independent living   Treatment Modalities: Medication Management, Group therapy, Case management,  1 to 1 session with clinician, Psychoeducation, Recreational therapy.   Physician Treatment Plan for Primary Diagnosis: Bipolar disorder, current episode manic severe with psychotic features ([place]) Long Term Goal(s): Improvement in symptoms so as ready for discharge   Short Term Goals: Ability to identify changes in lifestyle to reduce recurrence of condition will improve Ability to disclose and discuss suicidal ideas Ability to demonstrate self-control will improve Ability to identify and develop effective coping behaviors will improve Compliance with prescribed medications will improve Ability to identify triggers associated with substance abuse/mental health issues will improve  Medication Management: Evaluate patient's response, side effects, and tolerance of medication regimen.  Therapeutic Interventions: 1 to 1 sessions, Unit Group sessions and Medication administration.  Evaluation of Outcomes: Progressing  Physician Treatment Plan for Secondary Diagnosis: Principal Problem:   Bipolar disorder, current episode manic severe with psychotic features ([place]) Active Problems:   ADD (attention deficit disorder)   Insomnia   Anxiety state  Long Term Goal(s): Improvement in symptoms so as ready for discharge   Short Term Goals: Ability to  identify changes in lifestyle to reduce recurrence of condition will improve Ability to disclose and discuss suicidal ideas Ability to demonstrate self-control will improve Ability to identify and develop effective coping behaviors will improve Compliance with prescribed medications will improve Ability to identify triggers associated with substance abuse/mental health issues will improve     Medication Management: Evaluate patient's response, side effects, and tolerance of medication regimen.  Therapeutic Interventions: 1 to 1 sessions, Unit Group sessions and Medication administration.  Evaluation of Outcomes: Progressing   RN Treatment Plan for Primary Diagnosis: Bipolar disorder, current episode manic severe with psychotic features ([place]) Long Term Goal(s): Knowledge of disease and therapeutic regimen to maintain health will improve  Short Term Goals: Ability to remain free from injury will improve, Ability to verbalize frustration and anger appropriately will improve, Ability to demonstrate self-control, Ability to participate in decision making will improve, Ability to verbalize feelings will improve, Ability to disclose and discuss suicidal ideas, Ability to identify and develop effective coping behaviors will improve, and Compliance with prescribed medications will improve  Medication Management: RN will administer medications as ordered by provider, will assess and evaluate patient's response and provide education to patient for prescribed medication. RN will report any adverse and/or side effects to prescribing provider.  Therapeutic Interventions: 1 on 1 counseling sessions, Psychoeducation, Medication administration, Evaluate responses to treatment, Monitor vital signs and CBGs as ordered, Perform/monitor CIWA, COWS, AIMS and Fall Risk screenings as ordered, Perform wound care treatments as ordered.  Evaluation of Outcomes: Progressing   LCSW Treatment Plan for Primary Diagnosis:  Bipolar disorder, current episode manic severe with psychotic features ([place]) Long Term Goal(s): Safe transition to appropriate next level of care at discharge, Engage patient in therapeutic group addressing interpersonal concerns.  Short Term Goals: Engage patient in aftercare planning with referrals and resources, Increase social support, Increase ability to appropriately verbalize feelings, Increase emotional regulation, Facilitate acceptance of mental health diagnosis and concerns, Facilitate patient progression through stages of change regarding substance use diagnoses and concerns, Identify triggers associated with mental health/substance abuse issues, and Increase skills for wellness and recovery  Therapeutic Interventions: Assess for all discharge needs, 1 to 1 time with Social worker, Explore available resources and support systems, Assess for adequacy in community support network, Educate family and significant other(s) on suicide prevention, Complete Psychosocial Assessment, Interpersonal group therapy.  Evaluation of Outcomes: Progressing   Progress in Treatment: Attending groups: Yes. Participating in groups: Yes. Taking medication as prescribed: Yes. Toleration medication: Yes. Family/Significant other contact made: Yes, individual(s) contacted:  [name], Mother Patient understands diagnosis: Yes. Discussing patient identified problems/goals with staff: Yes. Medical problems stabilized or resolved: Yes. Denies suicidal/homicidal ideation: Yes. Issues/concerns per patient self-inventory: Yes. Other:   New problem(s) identified: No, Describe:  None Known  New Short Term/Long Term Goal(s): medication stabilization, elimination of SI thoughts, development of comprehensive mental wellness plan.    Patient Goals:  Making sure that take all of my medication  Discharge Plan or Barriers: CSW  will continue to follow and assess for appropriate referrals and possible discharge  planning.    Reason for Continuation of Hospitalization: Anxiety Delusions  Hallucinations Mania Medication stabilization  Estimated Length of Stay: 3-7 Days  Last [place] Suicide Severity Risk Score: [place] Admission (Current) from 04/16/2022 in [facility] ED from 04/15/2022 in [facility] at [place] No Risk Moderate Risk       Last PHQ 2/9 Scores:    07/20/2020    1:57 PM 11/30/2014    8:48 AM  Depression screen PHQ 2/9  Decreased Interest 0 0  Down, Depressed, Hopeless 0 0  PHQ - 2 Score 0 0     medication stabilization, elimination of SI thoughts, development of comprehensive mental wellness plan.   Scribe for Treatment Team: Windle Guard, LCSW 04/20/2022 11:15 AM

## 2022-04-20 NOTE — Progress Notes (Signed)
Adult Psychoeducational Group Note  Date:  04/20/2022 Time:  5:10 PM  Group Topic/Focus:  Goals Group:   The focus of this group is to help patients establish daily goals to achieve during treatment and discuss how the patient can incorporate goal setting into their daily lives to aide in recovery. Orientation:   The focus of this group is to educate the patient on the purpose and policies of crisis stabilization and provide a format to answer questions about their admission.  The group details unit policies and expectations of patients while admitted.  Participation Level:  Active  Participation Quality:  Appropriate  Affect:  Appropriate  Cognitive:  Appropriate  Insight: Appropriate  Engagement in Group:  Engaged  Modes of Intervention:  Discussion  Additional Comments:  Pt attended the goals/orientation group and remained appropriate and engaged throughout the duration of the group.   Beryle Beams 04/20/2022, 5:10 PM

## 2022-04-20 NOTE — Progress Notes (Signed)
D: Patient is alert and cooperative. Denies SI, HI, AVH, and verbally contracts for safety. Patient reports he slept good last night without sleeping medication. Patient reports his appetite as good, energy level as high, and concentration as good. Patient rates his depression 0/10, hopelessness 0/10, and anxiety 0/10. Patient denies physical symptoms/pain. Patient states his goal is "be careful on who I put myself around". Patient is seen out in the milieu and going to groups today.   A: Scheduled medications administered per MD order. Support provided. Patient educated on safety on the unit and medications. Routine safety checks every 15 minutes. Patient stated understanding to tell nurse about any new physical symptoms. Patient understands to tell staff of any needs.     R: No adverse drug reactions noted. Patient verbally contracts for safety. Patient remains safe at this time and will continue to monitor.    04/20/22 0900  Psych Admission Type (Psych Patients Only)  Admission Status Involuntary  Psychosocial Assessment  Patient Complaints None  Eye Contact Fair  Facial Expression Animated  Affect Appropriate to circumstance  Speech Logical/coherent  Interaction Assertive  Motor Activity Fidgety  Appearance/Hygiene Unremarkable  Behavior Characteristics Cooperative  Mood Pleasant  Thought Process  Coherency WDL  Content Blaming others;Paranoia  Delusions None reported or observed  Perception WDL  Hallucination None reported or observed  Judgment Poor  Confusion None  Danger to Self  Current suicidal ideation? Denies  Agreement Not to Harm Self Yes  Description of Agreement verbal  Danger to Others  Danger to Others None reported or observed

## 2022-04-20 NOTE — Progress Notes (Signed)
[facility] MD Progress Note  04/20/2022 11:22 AM [facility]  MRN:  [id number] Principal Problem: Bipolar disorder, current episode manic severe with psychotic features [facility]) Diagnosis: Principal Problem:   Bipolar disorder, current episode manic severe with psychotic features [facility] Active Problems:   ADD (attention deficit disorder)   Insomnia   Anxiety state  Reason for admission: Timothy L.  Granillo is a 37 year old Caucasian male with a self-reported history of ADHD who presented to the [facility] with complaints that someone was after him, and demanded to be incarcerated for his safety.  He also reported SI with a plan, and was transferred to the [facility], and later [facility] prior to being transferred to this [facility] for treatment and stabilization of his mental status.   24 hr chart review: Vital signs within normal limits, with the exception of heart rate which was slightly elevated at earlier today morning.  Patient is compliant with scheduled medications.  Has been visible in the day room interacting with peers and attending unit group activities.  No PRN medication given overnight.  No behavioral episodes noted in the past 24 hours.  Patient assessment note 04/19/2022: Upon evaluation today patient presents linear and organized reporting he had a good day yesterday, he denies any paranoia continues to deny SI HI or AVH when he attempted to start talking about his ex-girlfriend's in order to tease out paranoid thoughts reported to staff in the past few days he seems to be much less preoccupied with his paranoia from her and has a linear logical plan to address it after he leaves, "I will stay away from her I will stay around my family" he reports he is calling his mother daily and plans to go stay with her after discharge.  He is compliant with his medication on the unit and denies side effects with them he is reluctant about starting LAI  given he does not have insurance coverage and concerned about cost, he agrees to comply with medication and follow-up appointments after discharge.  Discussed with patient plan to call his mother for collateral information and to ensure improvement.  Mother reported to staff that 2 days ago during visit patient did continue to be suspicious and paranoid noting that their phone calls are being recorded, will follow.  Patient does not appear sleepy or drowsy or sedated, patient does not report any symptoms or display signs consistent with EPS or TD.  Total Time spent with patient: 35 minutes  Past Psychiatric History:See H & P  Past Medical History: History reviewed. No pertinent past medical history. History reviewed. No pertinent surgical history. Family History: History reviewed. No pertinent family history. Family Psychiatric  History: See H & P Social History:  Social History   Substance and Sexual Activity  Alcohol Use Not Currently     Social History   Substance and Sexual Activity  Drug Use Not Currently    Social History   Socioeconomic History   Marital status: Single    Spouse name: Not on file   Number of children: Not on file   Years of education: Not on file   Highest education level: Not on file  Occupational History   Not on file  Tobacco Use   Smoking status: Every Day    Packs/day: 2    Types: Cigarettes   Smokeless tobacco: Former    Quit date: 12/02/2012  Vaping Use   Vaping Use: Never used  Substance and Sexual Activity  Alcohol use: Not Currently   Drug use: Not Currently   Sexual activity: Not on file  Other Topics Concern   Not on file  Social History Narrative   Not on file   Social Determinants of Health   Financial Resource Strain: Not on file  Food Insecurity: No Food Insecurity (04/16/2022)   Hunger Vital Sign    Worried About Running Out of Food in the Last Year: Never true    Ran Out of Food in the Last Year: Never true  Transportation  Needs: No Transportation Needs (04/16/2022)   PRAPARE - Hydrologist (Medical): No    Lack of Transportation (Non-Medical): No  Physical Activity: Not on file  Stress: Not on file  Social Connections: Not on file   Sleep: Good  Appetite:  Fair  Current Medications: Current Facility-Administered Medications  Medication Dose Route Frequency Provider Last Rate Last Admin   acetaminophen (TYLENOL) tablet 650 mg  650 mg Oral Q6H PRN Derrill Center, NP       albuterol (VENTOLIN HFA) 108 (90 Base) MCG/ACT inhaler 2 puff  2 puff Inhalation Q4H PRN Derrill Center, NP       alum & mag hydroxide-simeth (MAALOX/MYLANTA) 200-200-20 MG/5ML suspension 30 mL  30 mL Oral Q4H PRN Derrill Center, NP       hydrOXYzine (ATARAX) tablet 25 mg  25 mg Oral TID PRN Nicholes Rough, NP       ziprasidone (GEODON) injection 20 mg  20 mg Intramuscular Once Derrill Center, NP       And   LORazepam (ATIVAN) tablet 1 mg  1 mg Oral PRN Derrill Center, NP       magnesium hydroxide (MILK OF MAGNESIA) suspension 30 mL  30 mL Oral Daily PRN Derrill Center, NP       naproxen (NAPROSYN) tablet 500 mg  500 mg Oral BID PRN Nicholes Rough, NP   500 mg at 04/19/22 1306   risperiDONE (RISPERDAL) tablet 1 mg  1 mg Oral Daily Nkwenti, Doris, NP   1 mg at 04/20/22 0804   risperiDONE (RISPERDAL) tablet 3 mg  3 mg Oral QHS Nkwenti, Doris, NP   3 mg at 04/19/22 2117   traZODone (DESYREL) tablet 50 mg  50 mg Oral QHS Nicholes Rough, NP   50 mg at 04/17/22 2107   Vitamin D (Ergocalciferol) (DRISDOL) 1.25 MG (50000 UNIT) capsule 50,000 Units  50,000 Units Oral Q7 days Nicholes Rough, NP   50,000 Units at 04/18/22 2122    Lab Results:  No results found for this or any previous visit (from the past 48 hour(s)).   Blood Alcohol level:  Lab Results  Component Value Date   ETH <10 [id number]    Metabolic Disorder Labs: Lab Results  Component Value Date   HGBA1C 5.4 04/18/2022   MPG 108 04/18/2022    No results found for: "PROLACTIN" Lab Results  Component Value Date   CHOL 138 04/18/2022   TRIG 115 04/18/2022   HDL 38 (L) 04/18/2022   CHOLHDL 3.6 04/18/2022   VLDL 23 04/18/2022   LDLCALC 77 04/18/2022   LDLCALC 97 06/01/2013    Physical Findings: AIMS:  , ,  ,  ,    CIWA:    COWS:     Musculoskeletal: Strength & Muscle Tone: within normal limits Gait & Station: normal Patient leans: N/A  Psychiatric Specialty Exam:  Presentation  General Appearance:  Fairly Groomed  Eye  Contact: Fair  Speech: Clear and Coherent  Speech Volume: Normal  Handedness: Right   Mood and Affect  Mood: Depressed  Affect: Congruent; Depressed   Thought Process  Thought Processes: Coherent  Descriptions of Associations:Intact  Orientation:Full (Time, Place and Person)  Thought Content:Paranoid Ideation; Perseveration Much less preoccupied with his delusions of paranoia, more linear and organized with no thought blocking noted History of Schizophrenia/Schizoaffective disorder:No  Duration of Psychotic Symptoms:Greater than six months  Hallucinations:Hallucinations: None  Ideas of Reference:None  Suicidal Thoughts:Suicidal Thoughts: No  Homicidal Thoughts:Homicidal Thoughts: No   Sensorium  Memory: Immediate Good  Judgment: Fair Improved Insight: Fair Improved  Executive Functions  Concentration: Fair  Attention Span: Fair  Recall: [employer] of Knowledge: Fair  Language: Fair   Psychomotor Activity  Psychomotor Activity: No data recorded   Assets  Assets: Communication Skills; Resilience; Social Support   Sleep  Sleep: Sleep: Good    Physical Exam: Physical Exam Vitals and nursing note reviewed.  Constitutional:      Appearance: Normal appearance.  HENT:     Head: Normocephalic.  Eyes:     Pupils: Pupils are equal, round, and reactive to light.  Pulmonary:     Effort: Pulmonary effort is normal.   Musculoskeletal:        General: Normal range of motion.     Cervical back: Normal range of motion.  Neurological:     Mental Status: He is alert and oriented to person, place, and time.     Sensory: No sensory deficit.  Psychiatric:        Behavior: Behavior normal.        Thought Content: Thought content normal.    Review of Systems  Constitutional:  Negative for fever.  HENT:  Negative for hearing loss and sore throat.   Eyes:  Negative for blurred vision.  Respiratory:  Negative for cough.   Cardiovascular:  Negative for chest pain.  Gastrointestinal:  Negative for heartburn.  Genitourinary: Negative.  Negative for dysuria.  Musculoskeletal:  Negative for myalgias.  Skin:  Negative for rash.  Neurological:  Negative for dizziness.  Psychiatric/Behavioral:  Negative for memory loss, substance abuse and suicidal ideas.   All other systems reviewed and are negative.  Blood pressure 111/74, pulse 91, temperature 97.7 F (36.5 C), temperature source Oral, resp. rate 16, height 5\' 8"  (1.727 m), weight 86.2 kg, SpO2 100 %. Body mass index is 28.89 kg/m.  Treatment Plan Summary: Daily contact with patient to assess and evaluate symptoms and progress in treatment and Medication management   Observation Level/Precautions:  15 minute checks  Laboratory:  Labs reviewed   Psychotherapy:  Unit Group sessions  Medications:  See [facility]  Consultations:  To be determined   Discharge Concerns:  Safety, medication compliance, mood stability  Estimated LOS: 5-7 days  Other:  N/A    Labs reviewed: lipid panel, hemoglobin A1c pending, TSH WNL, vitamin D levels low at 21.36, will supplement with 50.000 units weekly, EKG from 3/18 reviewed, QTc within normal limits, but EKG with rhythm of RBBB, will repeat today, pt denies all cardiac related symptoms   PLAN Safety and Monitoring: Voluntary admission to inpatient psychiatric unit for safety, stabilization and treatment Daily contact with  patient to assess and evaluate symptoms and progress in treatment Patient's case to be discussed in multi-disciplinary team meeting Observation Level : q15 minute checks Vital signs: q12 hours Precautions: Safety   Long Term Goal(s): Improvement in symptoms so as ready for discharge  Short Term Goals: Ability to identify changes in lifestyle to reduce recurrence of condition will improve, Ability to disclose and discuss suicidal ideas, Ability to demonstrate self-control will improve, Ability to identify and develop effective coping behaviors will improve, Compliance with prescribed medications will improve, and Ability to identify triggers associated with substance abuse/mental health issues will improve   Diagnoses:  Principal Problem:   Bipolar disorder, current episode manic severe with psychotic features ([place]) Active Problems:   ADD (attention deficit disorder)   Insomnia   Anxiety state   Medications -Continue risperidone 1 mg in the mornings and 3 mg QHS for psychosis/mood stabilization, well-tolerated with no side effects reported -Continue Vitamin D 50.000 units weekly  -Continue trazodone 50 mg nightly for sleep -Continue hydroxyzine 25 mg as needed 3 times daily -Continue Geodon/Ativan for agitation as needed-please see tomorrow for complete order -Continue albuterol as needed for wheezing/shortness of breath   Other PRNS -Continue Tylenol 650 mg every 6 hours PRN for mild pain -Continue Maalox 30 mg every 4 hrs PRN for indigestion -Continue Milk of Magnesia as needed every 6 hrs for constipation   ANTIPSYCHOTIC CONSENT : We discussed the risks, benefits, side effects, and alternatives to THE PRESCRIBED ANTIPSYCHOTIC, including but not limited to, the risk of fatigue, sedation, metabolic syndrome, weight gain, movement abnormalities such as tremor & cogwheeling & tardive dyskinesia, temperature sensitivity, photosensitivity, blood pressure changes, heart rhythm effects,  potential for medication interactions, and to not take these medications with alcohol or illicit drugs; informed consent was obtained. We discussed the necessity for routine monitoring including rating scales of abnormal movements, blood work, and ekgs, while the patient is prescribed antipsychotic medication.    Discharge Planning: Social work and case management to assist with discharge planning and identification of [facility] follow-up needs prior to discharge Estimated LOS: 5-7 days Discharge Concerns: Need to establish a safety plan; Medication compliance and effectiveness Discharge Goals: Return home with outpatient referrals for mental health follow-up including medication management/psychotherapy   I certify that inpatient services furnished can reasonably be expected to improve the patient's condition.    Dian Situ, MD 04/20/2022, 11:22 AMPatient ID: Hector Brunswick, male   DOB: 07-Sep-1985, 37 y.o.   MRN: [id number]

## 2022-04-20 NOTE — Group Note (Signed)
Recreation Therapy Group Note   Group Topic:Problem Solving  Group Date: 04/20/2022 Start Time: 0930 End Time: 1015 Facilitators: Raziya Aveni-McCall, LRT,CTRS Location: [address] Dayroom   Goal Area(s) Addresses:  Patient will effectively work with peer towards shared goal.  Patient will identify skills used to make activity successful.  Patient will share challenges and verbalize solution-driven approaches used. Patient will identify how skills used during activity can be used to reach post d/c goals.    Group Description: [employer]. Patients were provided the following materials: 2 drinking straws, 5 rubber bands, 5 paper clips, 2 index cards and 2 drinking cups. Using the provided materials patients were asked to build a launching mechanism to launch a ping pong ball across the room, approximately 10 feet. Patients were divided into teams of 3-5. Instructions required all materials be incorporated into the device, functionality of items left to the peer group's discretion.   Affect/Mood: Appropriate   Participation Level: Engaged   Participation Quality: Independent   Behavior: Appropriate   Speech/Thought Process: Focused   Insight: Good   Judgement: Good   Modes of Intervention: STEM Activity   Patient Response to Interventions:  Engaged   Education Outcome:  Acknowledges education and In group clarification offered    Clinical Observations/Individualized Feedback: Pt attended and participated in group session.    Plan: Continue to engage patient in RT group sessions 2-3x/week.   Brandon Wiechman-McCall, LRT,CTRS 04/20/2022 11:57 AM

## 2022-04-20 NOTE — [facility] Group Notes (Signed)
[place] Group Notes:  (Nursing/MHT/Case Management/Adjunct)  Date:  04/20/2022  Time:  8:28 PM  Type of Therapy:   AA  Participation Level:  Did Not Attend  Participation Quality:   na  Affect:   na  Cognitive:   na  Insight:  None  Engagement in Group:   na  Modes of Intervention:   na  Summary of Progress/Problems:Didn't attend AA group.  Orvan Falconer 04/20/2022, 8:28 PM

## 2022-04-20 NOTE — Progress Notes (Signed)
   04/20/22 2236  Psych Admission Type (Psych Patients Only)  Admission Status Involuntary  Psychosocial Assessment  Patient Complaints None  Eye Contact Fair  Facial Expression Animated  Affect Appropriate to circumstance  Speech Logical/coherent  Interaction Assertive  Motor Activity Fidgety;Restless  Appearance/Hygiene Unremarkable  Behavior Characteristics Appropriate to situation  Mood Pleasant  Thought Process  Coherency WDL  Content Blaming others  Delusions None reported or observed  Perception WDL  Hallucination None reported or observed  Judgment Poor  Confusion None  Danger to Self  Current suicidal ideation? Denies  Agreement Not to Harm Self Yes  Description of Agreement verbal  Danger to Others  Danger to Others None reported or observed

## 2022-04-21 DIAGNOSIS — F312 Bipolar disorder, current episode manic severe with psychotic features: Secondary | ICD-10-CM | POA: Diagnosis not present

## 2022-04-21 MED ORDER — TRAZODONE 25 MG HALF TABLET
25.0000 mg | ORAL_TABLET | Freq: Every day | ORAL | Status: DC
  Administered 2022-04-21: 25 mg via ORAL
  Filled 2022-04-21: qty 1

## 2022-04-21 NOTE — Progress Notes (Signed)
   04/21/22 2237  Psych Admission Type (Psych Patients Only)  Admission Status Involuntary  Psychosocial Assessment  Patient Complaints None  Eye Contact Fair  Facial Expression Animated  Affect Appropriate to circumstance  Speech Logical/coherent  Interaction Assertive  Motor Activity Fidgety;Restless  Appearance/Hygiene Unremarkable  Behavior Characteristics Appropriate to situation  Mood Pleasant  Thought Process  Coherency WDL  Content WDL  Delusions None reported or observed  Perception WDL  Hallucination None reported or observed  Judgment Poor  Confusion None  Danger to Self  Current suicidal ideation? Denies  Agreement Not to Harm Self Yes  Description of Agreement verbal  Danger to Others  Danger to Others None reported or observed

## 2022-04-21 NOTE — Progress Notes (Signed)
Pt on unit. Pt reports feeling "groggy" this morning. Pt denies SI/HI/self harm thoughts. Pt denies a/v hallucinations. Q 15 minutes checks ongoing for safety.

## 2022-04-21 NOTE — Group Note (Signed)
Date:  04/21/2022 Time:  2:53 PM  Group Topic/Focus:  Goals Group:   The focus of this group is to help patients establish daily goals to achieve during treatment and discuss how the patient can incorporate goal setting into their daily lives to aide in recovery. Orientation:   The focus of this group is to educate the patient on the purpose and policies of crisis stabilization and provide a format to answer questions about their admission.  The group details unit policies and expectations of patients while admitted.    Participation Level:  Active  Participation Quality:  Attentive  Affect:  Appropriate  Cognitive:  Appropriate  Insight: Appropriate  Engagement in Group:  Engaged  Modes of Intervention:  Discussion  Additional Comments:  Patient attended group and was attentive the duration of it.   Ashling Roane T Ria Comment 04/21/2022, 2:53 PM

## 2022-04-21 NOTE — BHH Group Notes (Signed)
[facility] Group Notes:  (Nursing/MHT/Case Management/Adjunct)  Date:  04/21/2022  Time:  9:27 PM  Type of Therapy:  Group Therapy  Participation Level:  Active  Participation Quality:  Appropriate  Affect:  Appropriate  Cognitive:  Appropriate  Insight:  Appropriate  Engagement in Group:  Engaged  Modes of Intervention:  Education  Summary of Progress/Problems: Goal to focus on self more. Day 9/10.  Orvan Falconer 04/21/2022, 9:27 PM
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
## 2022-04-22 DIAGNOSIS — F312 Bipolar disorder, current episode manic severe with psychotic features: Secondary | ICD-10-CM | POA: Diagnosis not present

## 2022-04-22 MED ORDER — RISPERIDONE 2 MG PO TABS
2.0000 mg | ORAL_TABLET | Freq: Every day | ORAL | Status: DC
  Administered 2022-04-23: 2 mg via ORAL
  Filled 2022-04-22 (×2): qty 1

## 2022-04-22 MED ORDER — MELATONIN 5 MG PO TABS
5.0000 mg | ORAL_TABLET | Freq: Every evening | ORAL | Status: DC | PRN
  Administered 2022-04-22 – 2022-04-26 (×3): 5 mg via ORAL
  Filled 2022-04-22 (×3): qty 1

## 2022-04-22 MED ORDER — DIPHENHYDRAMINE HCL 25 MG PO CAPS
25.0000 mg | ORAL_CAPSULE | Freq: Every day | ORAL | Status: DC
  Administered 2022-04-22: 25 mg via ORAL
  Filled 2022-04-22 (×2): qty 1

## 2022-04-22 MED ORDER — NICOTINE 14 MG/24HR TD PT24
14.0000 mg | MEDICATED_PATCH | Freq: Every day | TRANSDERMAL | Status: DC
  Administered 2022-04-22 – 2022-04-24 (×3): 14 mg via TRANSDERMAL
  Filled 2022-04-22 (×6): qty 1

## 2022-04-22 MED ORDER — RISPERIDONE 2 MG PO TABS
4.0000 mg | ORAL_TABLET | Freq: Every day | ORAL | Status: DC
  Administered 2022-04-22: 4 mg via ORAL
  Filled 2022-04-22 (×2): qty 2

## 2022-04-22 NOTE — Hospital Course (Signed)
3/24: Discontinue trazodone 25 mg Start Benadryl 25 mg nightly for dystonia and sleep Increase Risperdal 2 mg daily + Risperdal 4 mg nightly

## 2022-04-22 NOTE — Progress Notes (Signed)
Pt on unit. Pt endorses feeling "edgy" and states he believes its because he is craving nicotine. Nicotine patch ordered for pt. Pt endorses feeling less groggy with decreased dose of trazodone but states he did awaken some last night. Pt denies SI/HI/self harm thoughts as well as a/v hallucinations today. Q 15 minute checks ongoing for safety.

## 2022-04-22 NOTE — [facility] Group Notes (Signed)
[facility] Group Notes:  (Nursing/MHT/Case Management/Adjunct)  Date:  04/22/2022  Time:  9:15 PM  Type of Therapy:  Group Therapy  Participation Level:  Active  Participation Quality:  Appropriate  Affect:  Appropriate  Cognitive:  Appropriate  Insight:  Appropriate  Engagement in Group:  Engaged  Modes of Intervention:  Education  Summary of Progress/Problems: Goal work on self improvement. Day 10/10.  Orvan Falconer 04/22/2022, 9:15 PM

## 2022-04-22 NOTE — Group Note (Signed)
Date:  04/22/2022 Time:  6:46 PM  Group Topic/Focus:  Orientation:   The focus of this group is to educate the patient on the purpose and policies of crisis stabilization and provide a format to answer questions about their admission.  The group details unit policies and expectations of patients while admitted.    Participation Level:  Did Not Attend  Participation Quality:      Affect:      Cognitive:      Insight: None  Engagement in Group:      Modes of Intervention:      Additional Comments:     Jerrye Beavers 04/22/2022, 6:46 PM

## 2022-04-22 NOTE — Progress Notes (Signed)
   04/22/22 2000  Psych Admission Type (Psych Patients Only)  Admission Status Involuntary  Psychosocial Assessment  Patient Complaints None  Eye Contact Fair  Facial Expression Animated  Affect Appropriate to circumstance  Speech Logical/coherent  Interaction Assertive  Motor Activity Other (Comment) (wnl)  Appearance/Hygiene Unremarkable  Behavior Characteristics Cooperative  Mood Pleasant  Thought Process  Coherency WDL  Content WDL  Delusions None reported or observed  Perception WDL  Hallucination None reported or observed  Judgment Impaired  Confusion None  Danger to Self  Current suicidal ideation? Denies  Agreement Not to Harm Self Yes  Description of Agreement verbal  Danger to Others  Danger to Others None reported or observed   Alert/oriented. Makes needs/concerns known to staff. Pleasant cooperative with staff. Denies SI/HI/A/V hallucinations.. Patient request melatonin for sleep. Provider notified. . Will encourage continued compliance and progression towards goals. Verbally contracted for safety. Will continue to monitor.

## 2022-04-22 NOTE — Progress Notes (Addendum)
[facility] MD Progress Note  04/22/2022 12:49 PM Timothy Mccoy  MRN:  [id number]  Principal Problem: Bipolar disorder, current episode manic severe with psychotic features [facility]) Diagnosis: Principal Problem:   Bipolar disorder, current episode manic severe with psychotic features [facility]) Active Problems:   ADD (attention deficit disorder)   Insomnia   Anxiety state   Reason for Admission:  Timothy Mccoy is a 37 year old Caucasian male with a self-reported history of ADHD who presented to the [facility] Department with complaints that someone was after him, and demanded to be incarcerated for his safety. He also reported SI with a plan, and was transferred to the [facility], and later [facility] prior to being transferred to this behavioral health Hospital for treatment and stabilization of his mental status.  (admitted on 04/16/2022, total  LOS: 6 days )   Information Obtained Today During Patient Interview:  Patient evaluated on the unit, reports he is doing well.  Reports he slept well, but continues to feel that trazodone makes him groggy in the morning.  He reports his appetite is great.  Overall, patient reports his "life is changed" since admission.  He denies any paranoid ideations today related to his girlfriend harming him.  He reports he had a productive conversation with his mother last night.  On assessment he denies suicidal ideation.  He denies homicidal ideation.  He denies auditory and visual hallucinations.  Patient denies thought insertion, thought withdrawal, and ideas of reference.  Patient denies any side effects to currently prescribed psychiatric medications.  There is some mild dystonia at the right wrist during flexion and extension.  Patient amenable to starting Benadryl to address sleep and dystonia.  No somatic complaints.   Patient later evaluated in the afternoon to corroborate collateral call information.  He reports his girlfriend has been  attempting to contact him while in the hospital.  He also reports that he feels that she is tracking him through Google timeline.  Patient lacks insight into his delusional beliefs, also admits to believing that the girlfriend might be having an affair.  Confirmed with patient's mother that this "boyfriend" does not exist.   Collateral call with mother, Timothy Mccoy [phone]: Timothy Mccoy saw the patient yesterday, reports that the patient continues to exhibit paranoid ideations.  Admits to believing that there are microphones everywhere in the hospital, that he is being traced and watched by his ex-girlfriend.  She does not believe that the patient is at baseline, reports no appreciable improvements in mental status since his admission.  She describes a history of schizophrenia on her side of the family, both her uncle and maternal grandmother had schizophrenia.  Agree with Timothy Mccoy that patient is not ready for discharge.  Following this discussion with her, I went back and interviewed the patient and confirmed that he exhibited delusional and paranoid thought content.  Confirmed with Timothy Mccoy that the patient has a restraining order placed by the girlfriend, however girlfriend has reached out to mother and asked about the patient.    Timothy Mccoy clarifies the patient's girlfriend does not know he is at [facility], and would have no way of reaching out to him at this moment.   Pertinent information discussed during bed progression: No acute events overnight.    Past Psychiatric Hx: Previous Psych Diagnoses: ADHD Prior inpatient treatment: Denies Current/prior outpatient treatment: None Prior rehab hx: None Psychotherapy hx: None History of suicide attempt: None  history of homicide or aggression: Assault charge regarding girlfriend  Psychiatric medication history: Adderall, Concerta, Ritalin, states he last took any of this medications 15 years ago.  Denies any medication currently, denies any other medication  trials.   Psychiatric medication compliance history: Compliant, mother states that he stopped taking the stimulants above because he did not like the way it makes him feel.   Neuromodulation history: Denies Current Psychiatrist: None Current therapist: None   Substance Abuse Hx: Alcohol: Denies use Tobacco: 2 to 3 packs/week   Illicit drugs: Denies current use, states he has tried marijuana in the past but has not used in over a year. Rx drug abuse: Denies Rehab hx: Denies   Past Medical History: Medical Diagnoses: Denies Home Rx: Denies Prior Hosp: Denies Prior Surgeries/Trauma: Denies Head trauma, LOC, concussions, seizures: Denies Allergies: Denies LMP: N/A Contraception: Denies PCP: Denies having any at this time   Family History: Medical: Denies Psych: Denies Psych Rx: Denies SA/HA at: Denies Substance use family hx: Denies   Social History: Patient reports that he was born and raised in [place], states he never knew his dad because he passed away when he was 21 months old, states he was killed by a drunk driver.  He reports a good support system in his mother and his stepfather.  He reports that he currently resides with his mother and stepfather and his 41 year old son.  He denies that money is a problem at this time.   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   Peer Group: Denies Housing: With mother and step father and 60 year old son Finances: Denies this being a Insurance underwriter: Domestic violence Garment/textile technologist: Denies    Current Medications: Current Facility-Administered Medications  Medication Dose Route Frequency Provider Last Rate Last Admin   acetaminophen  (TYLENOL) tablet 650 mg  650 mg Oral Q6H PRN Derrill Center, NP       albuterol (VENTOLIN HFA) 108 (90 Base) MCG/ACT inhaler 2 puff  2 puff Inhalation Q4H PRN Derrill Center, NP       alum & mag hydroxide-simeth (MAALOX/MYLANTA) 200-200-20 MG/5ML suspension 30 mL  30 mL Oral Q4H PRN Derrill Center, NP       diphenhydrAMINE (BENADRYL) capsule 25 mg  25 mg Oral QHS Carrion-Carrero, Yareth Macdonnell, MD       hydrOXYzine (ATARAX) tablet 25 mg  25 mg Oral TID PRN Nicholes Rough, NP   25 mg at 04/21/22 2103   ziprasidone (GEODON) injection 20 mg  20 mg Intramuscular Once Derrill Center, NP       And   LORazepam (ATIVAN) tablet 1 mg  1 mg Oral PRN Derrill Center, NP       magnesium hydroxide (MILK OF MAGNESIA) suspension 30 mL  30 mL Oral Daily PRN Derrill Center, NP       naproxen (NAPROSYN) tablet 500 mg  500 mg Oral BID PRN Nicholes Rough, NP   500 mg at 04/19/22 1306   nicotine (NICODERM CQ - dosed in mg/24 hours) patch 14 mg  14 mg Transdermal Daily Attiah, Nadir, MD   14 mg at 04/22/22 0926   [START ON 04/23/2022] risperiDONE (RISPERDAL) tablet 2 mg  2 mg Oral Daily Carrion-Carrero, Amarah Brossman, MD       risperiDONE (RISPERDAL)  tablet 4 mg  4 mg Oral QHS Carrion-Carrero, Elna Radovich, MD       Vitamin D (Ergocalciferol) (DRISDOL) 1.25 MG (50000 UNIT) capsule 50,000 Units  50,000 Units Oral Q7 days Nicholes Rough, NP   50,000 Units at 04/18/22 2122    Lab Results: No results found for this or any previous visit (from the past 48 hour(s)).  Blood Alcohol level:  Lab Results  Component Value Date   ETH <10 [id number]    Metabolic Labs: Lab Results  Component Value Date   HGBA1C 5.4 04/18/2022   MPG 108 04/18/2022   No results found for: "PROLACTIN" Lab Results  Component Value Date   CHOL 138 04/18/2022   TRIG 115 04/18/2022   HDL 38 (L) 04/18/2022   CHOLHDL 3.6 04/18/2022   VLDL 23 04/18/2022   LDLCALC 77 04/18/2022   LDLCALC 97 06/01/2013    Sleep:Sleep: Good   Physical  Findings: AIMS: 0  CIWA:    COWS:     Psychiatric Specialty Exam:  Presentation  General Appearance: Appropriate for Environment; Casual; Fairly Groomed  Eye Contact:Fair  Speech:Clear and Coherent; Normal Rate  Speech Volume:Normal  Handedness:Right   Mood and Affect  Mood:Euthymic  Affect:Appropriate; Full Range; Congruent   Thought Process  Thought Processes:Coherent; Goal Directed; Linear  Descriptions of Associations:Intact  Orientation:Full (Time, Place and Person)  Thought Content:Logical; WDL  History of Schizophrenia/Schizoaffective disorder:No  Duration of Psychotic Symptoms:N/A  Hallucinations:Hallucinations: None  Ideas of Reference:Percusatory; Delusions  Suicidal Thoughts:Suicidal Thoughts: No  Homicidal Thoughts:Homicidal Thoughts: No   Sensorium  Memory:Immediate Fair  Judgment:Poor  Insight:Poor   Executive Functions  Concentration:Fair  Attention Span:Fair  Arial   Psychomotor Activity  Psychomotor Activity:Psychomotor Activity: Normal; Extrapyramidal Side Effects (EPS) Extrapyramidal Side Effects (EPS): Dystonia AIMS Completed?: Yes   Assets  Assets:Communication Skills; Desire for Improvement   Sleep  Sleep:Sleep: Good    Physical Exam: Physical Exam Constitutional:      General: He is not in acute distress.    Appearance: Normal appearance. He is not ill-appearing.  Pulmonary:     Effort: Pulmonary effort is normal. No respiratory distress.  Musculoskeletal:        General: Normal range of motion.  Neurological:     Mental Status: He is alert.    Review of Systems  Respiratory:  Negative for shortness of breath.   Cardiovascular:  Negative for chest pain.  Gastrointestinal:  Negative for abdominal pain, constipation and diarrhea.  Neurological:  Negative for dizziness and headaches.  Psychiatric/Behavioral:  Negative for depression, hallucinations,  substance abuse and suicidal ideas. The patient is not nervous/anxious and does not have insomnia.    Blood pressure 115/75, pulse (!) 102, temperature 97.9 F (36.6 C), resp. rate 14, height 5\' 8"  (1.727 m), weight 86.2 kg, SpO2 98 %. Body mass index is 28.89 kg/m.  Treatment Plan Summary: Daily contact with patient to assess and evaluate symptoms and progress in treatment and Medication management   ASSESSMENT: Long Term Goal(s): Improvement in symptoms so as ready for discharge   Short Term Goals: Ability to identify changes in lifestyle to reduce recurrence of condition will improve, Ability to disclose and discuss suicidal ideas, Ability to demonstrate self-control will improve, Ability to identify and develop effective coping behaviors will improve, Compliance with prescribed medications will improve, and Ability to identify triggers associated with substance abuse/mental health issues will improve   Diagnoses:  Principal Problem:   Bipolar disorder, current episode manic severe  with psychotic features ([place]) Active Problems:   ADD (attention deficit disorder)   Insomnia   Anxiety state    PLAN: Safety and Monitoring:  -- VOLUNTARY admission to inpatient psychiatric unit for safety, stabilization and treatment  -- Daily contact with patient to assess and evaluate symptoms and progress in treatment  -- Patient's case to be discussed in multi-disciplinary team meeting  -- Observation Level : q15 minute checks  -- Vital signs:  q12 hours  -- Precautions: suicide, elopement, and assault  2. Psychiatric Diagnoses and Treatment:  Discontinue trazodone 25 mg Start Benadryl 25 mg nightly for dystonia and sleep Increase Risperdal 2 mg daily + Risperdal 4 mg nightly Continue hydroxyzine 25 mg as needed 3 times daily Continue Geodon/Ativan for agitation as needed-please see tomorrow for complete order  Other PRNS -Continue Tylenol 650 mg every 6 hours PRN for mild pain -Continue  Maalox 30 mg every 4 hrs PRN for indigestion -Continue Milk of Magnesia as needed every 6 hrs for constipation  -- The risks/benefits/side-effects/alternatives to this medication were discussed in detail with the patient and time was given for questions. The patient consents to medication trial.              -- Metabolic profile and EKG monitoring obtained while on an atypical antipsychotic  BMI: 28.89 kg/m TSH: 1.821, WNL Lipid Panel: HDL 38, other values WNL HbgA1c: 5.4%, at goal QTc: 433             -- Encouraged patient to participate in unit milieu and in scheduled group therapies   -- Short Term Goals: Ability to identify changes in lifestyle to reduce recurrence of condition will improve and Ability to verbalize feelings will improve  -- Long Term Goals: Improvement in symptoms so as ready for discharge    3. Medical Issues Being Addressed:   Tobacco Use Disorder  -- Nicotine patch 21mg /24 hours ordered  -- Smoking cessation encouraged   Shortness of Breath  --Albuterol PRN  4. Discharge Planning:   -- Social work and case management to assist with discharge planning and identification of hospital follow-up needs prior to discharge  -- Estimated LOS: 5-7 days  -- Discharge Concerns: Need to establish a safety plan; Medication compliance and effectiveness  -- Discharge Goals: Return home with outpatient referrals for mental health follow-up including medication management/psychotherapy     I certify that inpatient services furnished can reasonably be expected to improve the patient's condition.    Dr. Jacques Navy, MD PGY-1, Psychiatry Residency  3/24/202412:49 PM

## 2022-04-23 DIAGNOSIS — F312 Bipolar disorder, current episode manic severe with psychotic features: Secondary | ICD-10-CM | POA: Diagnosis not present

## 2022-04-23 LAB — GLUCOSE, CAPILLARY: Glucose-Capillary: 95 mg/dL (ref 70–99)

## 2022-04-23 MED ORDER — HALOPERIDOL 5 MG PO TABS
5.0000 mg | ORAL_TABLET | Freq: Every day | ORAL | Status: AC
  Administered 2022-04-23: 5 mg via ORAL
  Filled 2022-04-23: qty 1

## 2022-04-23 MED ORDER — HALOPERIDOL 5 MG PO TABS
5.0000 mg | ORAL_TABLET | Freq: Two times a day (BID) | ORAL | Status: DC
  Administered 2022-04-24: 5 mg via ORAL
  Filled 2022-04-23 (×5): qty 1

## 2022-04-23 MED ORDER — BENZTROPINE MESYLATE 1 MG PO TABS
1.0000 mg | ORAL_TABLET | Freq: Two times a day (BID) | ORAL | Status: DC
  Administered 2022-04-23 – 2022-04-27 (×9): 1 mg via ORAL
  Filled 2022-04-23 (×13): qty 1

## 2022-04-23 NOTE — Progress Notes (Addendum)
Up to get morning VS, patient stated felt dizzy while standing. BP 96/35. Fluids pushed. Patient sitting down in dayroom and noted less responsive. Ice pack provided. Patient more alert after Icepack administered. Fluids pushed to increase blood volume. CBG 95. BP retaken 110/87, P-86, O2 100% on room air. Alert/oriented. Currently sitting in dining room eating a snack. Will continue to monitor. Reminded to continue to let staff know when feeling dizzy or lightheaded and when moving from lying to sitting to standing positions to use precaution to prevent falls. Provider notified.

## 2022-04-23 NOTE — Progress Notes (Signed)
Pt denied SI/HI/AVH this morning. Pt rated his depression a 0/10, anxiety a 0/10, and feelings of hopelessness a 0/10. Pt has been pleasant, calm, and cooperative throughout the shift. RN provided support and encouragement to patient. Pt given scheduled medications as prescribed. Q15 min checks verified for safety. Patient verbally contracts for safety. Patient compliant with medications and treatment plan. Patient is interacting well on the unit. Pt is safe on the unit.   04/23/22 1000  Psych Admission Type (Psych Patients Only)  Admission Status Voluntary  Psychosocial Assessment  Patient Complaints None  Eye Contact Fair  Facial Expression Animated  Affect Appropriate to circumstance  Speech Logical/coherent  Interaction Assertive  Motor Activity Other (Comment) (WDL)  Appearance/Hygiene Unremarkable  Behavior Characteristics Appropriate to situation;Cooperative  Mood Pleasant  Thought Process  Coherency WDL  Content WDL  Delusions None reported or observed  Perception WDL  Hallucination None reported or observed  Judgment Impaired  Confusion None  Danger to Self  Current suicidal ideation? Denies  Self-Injurious Behavior No self-injurious ideation or behavior indicators observed or expressed   Agreement Not to Harm Self Yes  Description of Agreement Pt verbally contracts for safety  Danger to Others  Danger to Others None reported or observed

## 2022-04-23 NOTE — Plan of Care (Signed)
  Problem: Safety: Goal: Periods of time without injury will increase Outcome: Progressing   

## 2022-04-23 NOTE — Progress Notes (Signed)
   04/23/22 2007  Psych Admission Type (Psych Patients Only)  Admission Status Voluntary  Psychosocial Assessment  Patient Complaints Anxiety  Eye Contact Fair  Facial Expression Animated  Affect Appropriate to circumstance  Speech Logical/coherent  Interaction Assertive  Motor Activity Other (Comment) (wnl)  Appearance/Hygiene Unremarkable  Behavior Characteristics Appropriate to situation  Mood Anxious;Guilty;Pleasant  Thought Process  Coherency WDL  Content WDL  Delusions None reported or observed  Perception WDL  Hallucination None reported or observed  Judgment Impaired  Confusion None  Danger to Self  Current suicidal ideation? Denies  Agreement Not to Harm Self Yes  Description of Agreement verbal  Danger to Others  Danger to Others None reported or observed   Alert/oriented. Makes needs/concerns known to staff. Pleasant cooperative with staff. Denies SI/HI/A/V hallucinations.  Patient states went to group. Will encourage continue compliance and progression towards goals. Verbally contracted for safety. Will continue to monitor.

## 2022-04-23 NOTE — [facility] Group Notes (Signed)
Pt attended [place] group. Pt was engaged and participated appropriately
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
## 2022-04-24 DIAGNOSIS — F312 Bipolar disorder, current episode manic severe with psychotic features: Secondary | ICD-10-CM | POA: Diagnosis not present

## 2022-04-24 MED ORDER — SERTRALINE HCL 50 MG PO TABS
50.0000 mg | ORAL_TABLET | Freq: Every day | ORAL | Status: DC
  Administered 2022-04-24 – 2022-04-27 (×4): 50 mg via ORAL
  Filled 2022-04-24 (×7): qty 1

## 2022-04-24 MED ORDER — GABAPENTIN 100 MG PO CAPS
100.0000 mg | ORAL_CAPSULE | Freq: Three times a day (TID) | ORAL | Status: DC
  Administered 2022-04-24 – 2022-04-27 (×9): 100 mg via ORAL
  Filled 2022-04-24 (×14): qty 1

## 2022-04-24 MED ORDER — HALOPERIDOL 2 MG PO TABS
2.0000 mg | ORAL_TABLET | Freq: Four times a day (QID) | ORAL | Status: DC
  Administered 2022-04-24 – 2022-04-27 (×11): 2 mg via ORAL
  Filled 2022-04-24 (×18): qty 1

## 2022-04-24 NOTE — [facility] Group Notes (Signed)
Spiritual care group on grief and loss facilitated by Chaplain Katy Sandon Yoho, [facility] and Colleen Pesci, counseling intern.  Group Goal: Support / Education around grief and loss  Members engage in facilitated group support and psycho-social education.  Group Description:  Following introductions and group rules, group members engaged in facilitated group dialogue and support around topic of loss, with particular support around experiences of loss in their lives. Group Identified types of loss (relationships / self / things) and identified patterns, circumstances, and changes that precipitate losses. Reflected on thoughts / feelings around loss, normalized grief responses, and recognized variety in grief experience. Group encouraged individual reflection on safe space and on the coping skills that they are already utilizing.  Group drew on Adlerian / Rogerian and narrative framework  Patient Progress: Did not attend.  

## 2022-04-24 NOTE — Group Note (Signed)
Date:  04/24/2022 Time:  10:35 AM  Group Topic/Focus:  Goals Group:   The focus of this group is to help patients establish daily goals to achieve during treatment and discuss how the patient can incorporate goal setting into their daily lives to aide in recovery. Orientation:   The focus of this group is to educate the patient on the purpose and policies of crisis stabilization and provide a format to answer questions about their admission.  The group details unit policies and expectations of patients while admitted.    Participation Level:  Active  Participation Quality:  Appropriate  Affect:  Appropriate  Cognitive:  Appropriate  Insight: Appropriate  Engagement in Group:  Engaged  Modes of Intervention:  Discussion and Education  Additional Comments:   Pt attended and actively participated in the Orientation./Goals group. Pt personal goal is to mend relationships with family.   Timothy Mccoy 04/24/2022, 10:35 AM

## 2022-04-24 NOTE — Group Note (Signed)
Date:  04/24/2022 Time:  2:00 PM  Group Topic/Focus:  Peer Support    Participation Level:  Did Not Attend  Participation Quality:   n/a  Affect:   n/a  Cognitive:   n/a  Insight: None  Engagement in Group:   n/a  Modes of Intervention:   n/a  Additional Comments:   Did not attend  [place] 04/24/2022, 2:00 PM
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
## 2022-04-24 NOTE — Progress Notes (Signed)
   04/24/22 0800  Psych Admission Type (Psych Patients Only)  Admission Status Voluntary  Psychosocial Assessment  Patient Complaints Anxiety  Eye Contact Fair  Facial Expression Animated  Affect Appropriate to circumstance  Speech Logical/coherent  Interaction Assertive  Motor Activity Other (Comment) (WDL)  Appearance/Hygiene Unremarkable  Behavior Characteristics Appropriate to situation  Mood Anxious  Thought Process  Coherency WDL  Content WDL  Delusions None reported or observed  Perception WDL  Hallucination None reported or observed  Judgment Impaired  Confusion None  Danger to Self  Current suicidal ideation? Denies  Self-Injurious Behavior No self-injurious ideation or behavior indicators observed or expressed   Agreement Not to Harm Self Yes  Description of Agreement Verbal  Danger to Others  Danger to Others None reported or observed

## 2022-04-24 NOTE — Progress Notes (Signed)
The patient rated his day as an 8.5 out of 10 and states that he is getting his medication "regulated". His positive event for the day is that he visited with his wife.

## 2022-04-24 NOTE — Group Note (Signed)
Recreation Therapy Group Note   Group Topic:Animal Assisted Therapy   Group Date: 04/24/2022 Start Time: [id number] End Time: 1030 Facilitators: Karinda Cabriales-McCall, LRT,CTRS Location: [address] Dayroom   Animal-Assisted Activity (AAA) Program Checklist/Progress Notes Patient Eligibility Criteria Checklist & Daily Group note for Rec Tx Intervention  AAA/T Program Assumption of Risk Form signed by Patient/ or Parent Legal Guardian Yes  Patient is free of allergies or severe asthma Yes  Patient reports no fear of animals Yes  Patient reports no history of cruelty to animals Yes  Patient understands his/her participation is voluntary Yes  Patient washes hands before animal contact Yes  Patient washes hands after animal contact Yes   Affect/Mood: Appropriate   Participation Level: Engaged   Participation Quality: Independent   Behavior: Appropriate   Speech/Thought Process: Focused    Clinical Observations/Individualized Feedback: Patient attended session and interacted appropriately with therapy dog and peers. Patient asked appropriate questions about therapy dog and his training. Patient shared stories about their pets at home with group.    Plan: Continue to engage patient in RT group sessions 2-3x/week.   Natalyah Cummiskey-McCall, LRT,CTRS  04/24/2022 2:11 PM

## 2022-04-24 NOTE — Progress Notes (Signed)
[facility] MD Progress Note  04/24/2022 3:14 PM Timothy Mccoy  MRN:  [id number]  Principal Problem: Bipolar disorder, current episode manic severe with psychotic features [facility]) Diagnosis: Principal Problem:   Bipolar disorder, current episode manic severe with psychotic features [facility]) Active Problems:   ADD (attention deficit disorder)   Insomnia   Anxiety state   Reason for Admission:  Timothy Mccoy is a 37 year old Caucasian male with a self-reported history of ADHD who presented to the [facility] Department with complaints that someone was after him, and demanded to be incarcerated for his safety. He also reported SI with a plan, and was transferred to the [facility], and later [facility] prior to being transferred to this behavioral health Hospital for treatment and stabilization of his mental status.  (admitted on 04/16/2022, total  LOS: 8 days )   Information Obtained Today During Patient Interview:  Patient evaluated on the unit, reports he is "not doing well".  Feels irritable, is ruminative on losing money while being in the hospital.  Reports an episode of dizziness this morning, states "you are trying to keep me here, the medication you are giving me is poisoning me". He admits that he continues to contact his girlfriend, states "my mother does not want her in my life, she is lying about me". When discussing his paranoid ideations, he states "I am being monitored, that is why I have had to change my phone 6 times".  Patient also reports hitting his head on a table in the break room, reports having convulsions.  This information was discussed during bed progression, staff clarified patient was dizzy, with positive orthostasis, and was seated, but there was no head trauma or seizures witnessed.  On assessment, patient denies suicidal ideation and homicidal ideation.  On assessment, patient denies thought insertion, thought withdrawal, ideas of reference.  He  denies any auditory or visual hallucinations.   Past Psychiatric Hx: Previous Psych Diagnoses: ADHD Prior inpatient treatment: Denies Current/prior outpatient treatment: None Prior rehab hx: None Psychotherapy hx: None History of suicide attempt: None  history of homicide or aggression: Assault charge regarding girlfriend   Psychiatric medication history: Adderall, Concerta, Ritalin, states he last took any of this medications 15 years ago.  Denies any medication currently, denies any other medication trials.   Psychiatric medication compliance history: Compliant, mother states that he stopped taking the stimulants above because he did not like the way it makes him feel.   Neuromodulation history: Denies Current Psychiatrist: None Current therapist: None   Substance Abuse Hx: Alcohol: Denies use Tobacco: 2 to 3 packs/week   Illicit drugs: Denies current use, states he has tried marijuana in the past but has not used in over a year. Rx drug abuse: Denies Rehab hx: Denies   Past Medical History: Medical Diagnoses: Denies Home Rx: Denies Prior Hosp: Denies Prior Surgeries/Trauma: Denies Head trauma, LOC, concussions, seizures: Denies Allergies: Denies LMP: N/A Contraception: Denies PCP: Denies having any at this time   Family History: Medical: Denies Psych: Denies Psych Rx: Denies SA/HA at: Denies Substance use family hx: Denies   Social History: Patient reports that he was born and raised in [place], states he never knew his dad because he passed away when he was 52 months old, states he was killed by a drunk driver.  He reports a good support system in his mother and his stepfather.  He reports that he currently resides with his mother and stepfather and his 46 year old son.  He denies that money is a problem at this time.   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   Peer Group: Denies Housing: With mother and step father and 22 year old son Finances: Denies this being a Insurance underwriter: Domestic violence Garment/textile technologist: Denies    Current Medications: Current Facility-Administered Medications  Medication Dose Route Frequency Provider Last Rate Last Admin   acetaminophen (TYLENOL) tablet 650 mg  650 mg Oral Q6H PRN Derrill Center, NP       albuterol (VENTOLIN HFA) 108 (90 Base) MCG/ACT inhaler 2 puff  2 puff Inhalation Q4H PRN Derrill Center, NP       alum & mag hydroxide-simeth (MAALOX/MYLANTA) 200-200-20 MG/5ML suspension 30 mL  30 mL Oral Q4H PRN Derrill Center, NP       benztropine (COGENTIN) tablet 1 mg  1 mg Oral BID Carrion-Carrero, Hazleigh Mccleave, MD   1 mg at 04/24/22 0825   haloperidol (HALDOL) tablet 5 mg  5 mg Oral Q12H Carrion-Carrero, Ad Guttman, MD   5 mg at 04/24/22 0825   hydrOXYzine (ATARAX) tablet 25 mg  25 mg Oral TID PRN Nicholes Rough, NP   25 mg at 04/21/22 2103   ziprasidone (GEODON) injection 20 mg  20 mg Intramuscular Once Derrill Center, NP       And   LORazepam (ATIVAN) tablet 1 mg  1 mg Oral PRN Derrill Center, NP       magnesium hydroxide (MILK OF MAGNESIA) suspension 30 mL  30 mL Oral Daily PRN Derrill Center, NP       melatonin tablet 5 mg  5 mg Oral QHS PRN Bobbitt, Shalon E, NP   5 mg at 04/23/22 2114   naproxen (NAPROSYN) tablet 500 mg  500 mg Oral BID PRN Nicholes Rough, NP   500 mg at 04/19/22 1306   nicotine (NICODERM CQ - dosed in mg/24 hours) patch 14 mg  14 mg Transdermal Daily Attiah, Nadir, MD   14 mg at 04/24/22 0825   Vitamin D (Ergocalciferol) (DRISDOL) 1.25 MG (50000 UNIT) capsule 50,000 Units  50,000 Units Oral Q7 days Nicholes Rough, NP   50,000 Units at 04/18/22 2122    Lab Results:  Results for orders placed or  performed during the hospital encounter of 04/16/22 (from the past 48 hour(s))  Glucose, capillary     Status: None   Collection Time: 04/23/22  6:13 AM  Result Value Ref Range   Glucose-Capillary 95 70 - 99 mg/dL    Comment: Glucose reference range applies only to samples taken after fasting for at least 8 hours.    Blood Alcohol level:  Lab Results  Component Value Date   ETH <10 [id number]    Metabolic Labs: Lab Results  Component Value Date   HGBA1C 5.4 04/18/2022   MPG 108 04/18/2022   No results found for: "PROLACTIN" Lab Results  Component Value Date   CHOL 138 04/18/2022   TRIG 115 04/18/2022   HDL 38 (L) 04/18/2022   CHOLHDL 3.6 04/18/2022   VLDL 23 04/18/2022   LDLCALC 77 04/18/2022   LDLCALC 97 06/01/2013    Sleep:Sleep: Good   Physical Findings: AIMS: 0  CIWA:    COWS:  Psychiatric Specialty Exam:  Presentation  General Appearance: Casual  Eye Contact:Good  Speech:Clear and Coherent; Normal Rate  Speech Volume:Normal  Handedness:Right   Mood and Affect  Mood:-- [place], I can't trust you guys")  Affect:Congruent; Labile   Thought Process  Thought Processes:Coherent; Linear  Descriptions of Associations:Intact  Orientation:Full (Time, Place and Person)  Thought Content:Illogical; Paranoid Ideation; Obsessions  History of Schizophrenia/Schizoaffective disorder:No  Duration of Psychotic Symptoms:N/A  Hallucinations:Hallucinations: None  Ideas of Reference:None  Suicidal Thoughts:Suicidal Thoughts: No  Homicidal Thoughts:Homicidal Thoughts: No   Sensorium  Memory:Immediate Poor; Recent Poor; Remote Poor  Judgment:Poor  Insight:Poor   Executive Functions  Concentration:Poor  Attention Span:Fair  Recall:Poor  Fund of Knowledge:Fair  Language:Fair   Psychomotor Activity  Psychomotor Activity:Psychomotor Activity: Extrapyramidal Side Effects (EPS) Extrapyramidal Side Effects (EPS): Dystonia AIMS  Completed?: Yes   Assets  Assets:Communication Skills; Desire for Improvement; Social Support   Sleep  Sleep:Sleep: Good    Physical Exam: Physical Exam Constitutional:      General: He is not in acute distress.    Appearance: Normal appearance. He is not ill-appearing.  Pulmonary:     Effort: Pulmonary effort is normal. No respiratory distress.  Musculoskeletal:        General: Normal range of motion.  Neurological:     Mental Status: He is alert.    Review of Systems  Respiratory:  Negative for shortness of breath.   Cardiovascular:  Negative for chest pain.  Gastrointestinal:  Negative for abdominal pain, constipation and diarrhea.  Neurological:  Negative for dizziness and headaches.  Psychiatric/Behavioral:  Negative for depression, hallucinations, substance abuse and suicidal ideas. The patient is not nervous/anxious and does not have insomnia.    Blood pressure 109/71, pulse (!) 108, temperature 98.1 F (36.7 C), temperature source Oral, resp. rate 16, height 5\' 8"  (1.727 m), weight 86.2 kg, SpO2 98 %. Body mass index is 28.89 kg/m.  Treatment Plan Summary: Daily contact with patient to assess and evaluate symptoms and progress in treatment and Medication management   ASSESSMENT: Long Term Goal(s): Improvement in symptoms so as ready for discharge   Short Term Goals: Ability to identify changes in lifestyle to reduce recurrence of condition will improve, Ability to disclose and discuss suicidal ideas, Ability to demonstrate self-control will improve, Ability to identify and develop effective coping behaviors will improve, Compliance with prescribed medications will improve, and Ability to identify triggers associated with substance abuse/mental health issues will improve   Diagnoses:  Principal Problem:   Bipolar disorder, current episode manic severe with psychotic features ([place]) Active Problems:   ADD (attention deficit disorder)   Insomnia   Anxiety  state   PLAN: Safety and Monitoring:  -- VOLUNTARY admission to inpatient psychiatric unit for safety, stabilization and treatment  -- Daily contact with patient to assess and evaluate symptoms and progress in treatment  -- Patient's case to be discussed in multi-disciplinary team meeting  -- Observation Level : q15 minute checks  -- Vital signs:  q12 hours  -- Precautions: suicide, elopement, and assault  2. Psychiatric Diagnoses and Treatment:  Modify Haldol dosing from 5 mg every 12 hours to Haldol 2 mg 4 times daily The patient continues to experience orthostatic hypotension with previous dose of Haldol Continue Cogentin 1 mg twice daily, for dystonia and other EPS prophylaxis Start gabapentin 100 mg 3 times daily for anxiety Start Zoloft 50 mg daily for anxiety Continue hydroxyzine 25 mg as needed 3 times daily Continue Geodon/Ativan for PRN agitation  Other PRNS -Continue Tylenol 650 mg every 6 hours PRN for mild pain -Continue Maalox 30 mg every 4 hrs PRN for indigestion -Continue Milk of Magnesia as needed every 6 hrs for constipation  -- The risks/benefits/side-effects/alternatives to this medication were discussed in detail with the patient and time was given for questions. The patient consents to medication trial.              -- Metabolic profile and EKG monitoring obtained while on an atypical antipsychotic  BMI: 28.89 kg/m TSH: 1.821, WNL Lipid Panel: HDL 38, other values WNL HbgA1c: 5.4%, at goal QTc: 433             -- Encouraged patient to participate in unit milieu and in scheduled group therapies   -- Short Term Goals: Ability to identify changes in lifestyle to reduce recurrence of condition will improve and Ability to verbalize feelings will improve  -- Long Term Goals: Improvement in symptoms so as ready for discharge    3. Medical Issues Being Addressed:   Tobacco Use Disorder  -- Nicotine patch 21mg /24 hours ordered  -- Smoking cessation  encouraged   Shortness of Breath  --Albuterol PRN   Orthostatic hypotension, symptomatic  -- Discontinue offending agent  --Continue monitoring vitals daily  4. Discharge Planning:   -- Social work and case management to assist with discharge planning and identification of hospital follow-up needs prior to discharge  -- Estimated LOS: 5-7 days, pending improvement of psychiatric symptoms  -- Discharge Concerns: Need to establish a safety plan; Medication compliance and effectiveness  -- Discharge Goals: Return home with outpatient referrals for mental health follow-up including medication management/psychotherapy     I certify that inpatient services furnished can reasonably be expected to improve the patient's condition.    Dr. Jacques Navy, MD PGY-1, Psychiatry Residency  3/26/20243:14 PM

## 2022-04-25 ENCOUNTER — Encounter (HOSPITAL_COMMUNITY): Payer: Self-pay

## 2022-04-25 DIAGNOSIS — F312 Bipolar disorder, current episode manic severe with psychotic features: Secondary | ICD-10-CM | POA: Diagnosis not present

## 2022-04-25 MED ORDER — NICOTINE 7 MG/24HR TD PT24
7.0000 mg | MEDICATED_PATCH | Freq: Every day | TRANSDERMAL | Status: DC
  Administered 2022-04-25 – 2022-04-27 (×3): 7 mg via TRANSDERMAL
  Filled 2022-04-25 (×4): qty 1

## 2022-04-25 NOTE — [facility] Group Notes (Signed)
Pt did not attend N/A

## 2022-04-25 NOTE — Progress Notes (Signed)
[facility] MD Progress Note  04/25/2022 11:03 AM Timothy Mccoy  MRN:  [id number]  Principal Problem: Bipolar disorder, current episode manic severe with psychotic features [facility]) Diagnosis: Principal Problem:   Bipolar disorder, current episode manic severe with psychotic features [facility]) Active Problems:   ADD (attention deficit disorder)   Insomnia   Anxiety state   Reason for Admission:  Timothy Mccoy is a 37 year old Caucasian male with a self-reported history of ADHD who presented to the [facility] Department with complaints that someone was after him, and demanded to be incarcerated for his safety. He also reported SI with a plan, and was transferred to the [facility], and later [facility] [address] prior to being transferred to this behavioral health Hospital for treatment and stabilization of his mental status.  (admitted on 04/16/2022, total  LOS: 9 days )   Information Obtained Today During Patient Interview:  Patient evaluated on the unit. Reports he is doing well today, less anxious. Denies any dizziness or weakness, no orthostasis on chart review. Paranoid ideations are less prominent today, reports he is motivated to get better and take care of his son. Denies thoughts of being surveilled today. He reports that his girlfriend has visited him.  During bed progression, it was noted that patient questing to add an additional visitor to the list, but are unable to confirm if this girlfriend has visited him.  Patient reports he has plans to speak to his mother today.  Reports adequate sleep and appetite.   On assessment he denies suicidal ideation. Denies homicidal ideation. Denies auditory and visual hallucinations.   Patient denies any side effects to currently prescribed psychiatric medications.  Denies any somatic complaints.  Past Psychiatric Hx: Previous Psych Diagnoses: ADHD Prior inpatient treatment: Denies Current/prior outpatient treatment: None Prior rehab  hx: None Psychotherapy hx: None History of suicide attempt: None  history of homicide or aggression: Assault charge regarding girlfriend   Psychiatric medication history: Adderall, Concerta, Ritalin, states he last took any of this medications 15 years ago.  Denies any medication currently, denies any other medication trials.   Psychiatric medication compliance history: Compliant, mother states that he stopped taking the stimulants above because he did not like the way it makes him feel.   Neuromodulation history: Denies Current Psychiatrist: None Current therapist: None   Substance Abuse Hx: Alcohol: Denies use Tobacco: 2 to 3 packs/week   Illicit drugs: Denies current use, states he has tried marijuana in the past but has not used in over a year. Rx drug abuse: Denies Rehab hx: Denies   Past Medical History: Medical Diagnoses: Denies Home Rx: Denies Prior Hosp: Denies Prior Surgeries/Trauma: Denies Head trauma, LOC, concussions, seizures: Denies Allergies: Denies LMP: N/A Contraception: Denies PCP: Denies having any at this time   Family History: Medical: Denies Psych: Denies Psych Rx: Denies SA/HA at: Denies Substance use family hx: Denies   Social History: Patient reports that he was born and raised in [place], states he never knew his dad because he passed away when he was 29 months old, states he was killed by a drunk driver.  He reports a good support system in his mother and his stepfather.  He reports that he currently resides with his mother and stepfather and his 33 year old son.  He denies that money is a problem at this time.   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    Peer Group: Denies Housing: With mother and step father and 38 year old son Finances: Denies this being a Insurance underwriter: Domestic violence Garment/textile technologist: Denies    Current Medications: Current Facility-Administered Medications  Medication Dose Route Frequency Provider Last Rate Last Admin   acetaminophen (TYLENOL) tablet 650 mg  650 mg Oral Q6H PRN Derrill Center, NP       albuterol (VENTOLIN HFA) 108 (90 Base) MCG/ACT inhaler 2 puff  2 puff Inhalation Q4H PRN Derrill Center, NP       alum & mag hydroxide-simeth (MAALOX/MYLANTA) 200-200-20 MG/5ML suspension 30 mL  30 mL Oral Q4H PRN Derrill Center, NP       benztropine (COGENTIN) tablet 1 mg  1 mg Oral BID Carrion-Carrero, Rafeef Lau, MD   1 mg at 04/25/22 [id number]   gabapentin (NEURONTIN) capsule 100 mg  100 mg Oral TID Christene Slates, MD   100 mg at 04/25/22 0827   haloperidol (HALDOL) tablet 2 mg  2 mg Oral QID Carrion-Carrero, Derron Pipkins, MD   2 mg at 04/25/22 [id number]   hydrOXYzine (ATARAX) tablet 25 mg  25 mg Oral TID PRN Nicholes Rough, NP   25 mg at 04/21/22 2103   ziprasidone (GEODON) injection 20 mg  20 mg Intramuscular Once Derrill Center, NP       And   LORazepam (ATIVAN) tablet 1 mg  1 mg Oral PRN Derrill Center, NP       magnesium hydroxide (MILK OF MAGNESIA) suspension 30 mL  30 mL Oral Daily PRN Derrill Center, NP       melatonin tablet 5 mg  5 mg Oral QHS PRN Bobbitt, Shalon E, NP   5 mg at 04/23/22 2114   naproxen (NAPROSYN) tablet 500 mg  500 mg Oral BID PRN Nicholes Rough, NP   500 mg at 04/19/22 1306   nicotine (NICODERM CQ - dosed in mg/24 hr) patch 7 mg  7 mg Transdermal Daily Attiah, Nadir, MD       sertraline (ZOLOFT) tablet 50 mg  50 mg Oral Daily Carrion-Carrero, Jasime Westergren, MD   50 mg at 04/25/22 [id number]   Vitamin D (Ergocalciferol) (DRISDOL) 1.25 MG (50000 UNIT) capsule 50,000 Units  50,000 Units Oral Q7 days Nicholes Rough, NP   50,000 Units at 04/18/22 2122    Lab Results:  No results found for this or any  previous visit (from the past 48 hour(s)).   Blood Alcohol level:  Lab Results  Component Value Date   ETH <10 [id number]    Metabolic Labs: Lab Results  Component Value Date   HGBA1C 5.4 04/18/2022   MPG 108 04/18/2022   No results found for: "PROLACTIN" Lab Results  Component Value Date   CHOL 138 04/18/2022   TRIG 115 04/18/2022   HDL 38 (L) 04/18/2022   CHOLHDL 3.6 04/18/2022   VLDL 23 04/18/2022   LDLCALC 77 04/18/2022   LDLCALC 97 06/01/2013    Sleep:Sleep: Good Number of Hours of Sleep: 8   Physical Findings: AIMS: 0  CIWA:    COWS:     Psychiatric Specialty Exam:  Presentation  General Appearance: Casual  Eye Contact:Good  Speech:Clear and Coherent; Normal Rate  Speech Volume:Normal  Handedness:Right   Mood and Affect  Mood:-- ("much better")  Affect:Congruent; Appropriate; Full Range   Thought Process  Thought Processes:Linear  Descriptions of Associations:Intact  Orientation:Full (Time, Place and Person)  Thought Content:Logical  History of Schizophrenia/Schizoaffective disorder:No  Duration of Psychotic Symptoms:N/A  Hallucinations:Hallucinations: None  Ideas of Reference:None  Suicidal Thoughts:Suicidal Thoughts: No  Homicidal Thoughts:Homicidal Thoughts: No   Sensorium  Memory:Immediate Fair; Recent Fair; Remote Fair  Judgment:Fair  Insight:Fair   Executive Functions  Concentration:Fair  Attention Span:Fair  [place]   Psychomotor Activity  Psychomotor Activity:Psychomotor Activity: Extrapyramidal Side Effects (EPS) Extrapyramidal Side Effects (EPS): Dystonia AIMS Completed?: No   Assets  Assets:Communication Skills; Social Support; Catering manager   Sleep  Sleep:Sleep: Good Number of Hours of Sleep: 8    Physical Exam: Physical Exam Constitutional:      General: He is not in acute distress.    Appearance: Normal appearance. He is  not ill-appearing.  Pulmonary:     Effort: Pulmonary effort is normal. No respiratory distress.  Musculoskeletal:        General: Normal range of motion.  Neurological:     Mental Status: He is alert.    Review of Systems  Respiratory:  Negative for shortness of breath.   Cardiovascular:  Negative for chest pain.  Gastrointestinal:  Negative for abdominal pain, constipation and diarrhea.  Neurological:  Negative for dizziness and headaches.  Psychiatric/Behavioral:  Negative for depression, hallucinations, substance abuse and suicidal ideas. The patient is not nervous/anxious and does not have insomnia.    Blood pressure 118/72, pulse 94, temperature (!) 97.4 F (36.3 C), temperature source Oral, resp. rate 16, height 5\' 8"  (1.727 m), weight 86.2 kg, SpO2 97 %. Body mass index is 28.89 kg/m.  Treatment Plan Summary: Daily contact with patient to assess and evaluate symptoms and progress in treatment and Medication management   ASSESSMENT: Long Term Goal(s): Improvement in symptoms so as ready for discharge   Short Term Goals: Ability to identify changes in lifestyle to reduce recurrence of condition will improve, Ability to disclose and discuss suicidal ideas, Ability to demonstrate self-control will improve, Ability to identify and develop effective coping behaviors will improve, Compliance with prescribed medications will improve, and Ability to identify triggers associated with substance abuse/mental health issues will improve   Diagnoses:  Principal Problem:   Bipolar disorder, current episode manic severe with psychotic features ([place]) Active Problems:   ADD (attention deficit disorder)   Insomnia   Anxiety state   PLAN: Safety and Monitoring:  -- INVOLUNTARY admission to inpatient psychiatric unit for safety, stabilization and treatment  -- Daily contact with patient to assess and evaluate symptoms and progress in treatment  -- Patient's case to be discussed in  multi-disciplinary team meeting  -- Observation Level : q15 minute checks  -- Vital signs:  q12 hours  -- Precautions: suicide, elopement, and assault  2. Psychiatric Diagnoses and Treatment:  Continue Haldol 2 mg QID for paranoia Continue Cogentin 1 mg twice daily, for dystonia and other EPS prophylaxis Continue Gabapentin 100 mg 3 times daily for anxiety Continue Zoloft 50 mg daily for anxiety Continue hydroxyzine 25 mg as needed 3 times daily Continue Geodon/Ativan for PRN agitation  Other PRNS -Continue Tylenol 650 mg every 6 hours PRN for mild pain -Continue Maalox 30 mg every 4 hrs PRN for indigestion -Continue Milk of Magnesia as needed every 6 hrs for constipation  -- The risks/benefits/side-effects/alternatives to this medication were discussed in detail with the patient and time was given for questions. The patient consents to medication trial.              --  Metabolic profile and EKG monitoring obtained while on an atypical antipsychotic  BMI: 28.89 kg/m TSH: 1.821, WNL Lipid Panel: HDL 38, other values WNL HbgA1c: 5.4%, at goal QTc: 433             -- Encouraged patient to participate in unit milieu and in scheduled group therapies   -- Short Term Goals: Ability to identify changes in lifestyle to reduce recurrence of condition will improve and Ability to verbalize feelings will improve  -- Long Term Goals: Improvement in symptoms so as ready for discharge    3. Medical Issues Being Addressed:   Tobacco Use Disorder  -- Nicotine patch 21mg /24 hours ordered  -- Smoking cessation encouraged   Shortness of Breath  --Albuterol PRN   Orthostatic hypotension, symptomatic  -- Discontinue offending agent  --Continue monitoring vitals daily  4. Discharge Planning:   -- Social work and case management to assist with discharge planning and identification of hospital follow-up needs prior to discharge  -- Estimated LOS: 5-7 days, pending improvement of psychiatric  symptoms  -- Discharge Concerns: Need to establish a safety plan; Medication compliance and effectiveness  -- Discharge Goals: Return home with outpatient referrals for mental health follow-up including medication management/psychotherapy     I certify that inpatient services furnished can reasonably be expected to improve the patient's condition.    Dr. Jacques Navy, MD PGY-1, Psychiatry Residency  3/27/202411:03 AM

## 2022-04-25 NOTE — Group Note (Signed)
Date:  04/25/2022 Time:  5:12 PM  Group Topic/Focus:  Emotional Education:   The focus of this group is to discuss what feelings/emotions are, and how they are experienced.    Participation Level:  Active  Participation Quality:  Appropriate  Affect:  Appropriate  Cognitive:  Appropriate  Insight: Appropriate  Engagement in Group:  Engaged  Modes of Intervention:  Exploration  Additional Comments:     Jerrye Beavers 04/25/2022, 5:12 PM

## 2022-04-25 NOTE — BHH Group Notes (Signed)
[place] Group Notes:  (Nursing/MHT/Case Management/Adjunct)  Date:  04/25/2022  Time:  9:47 AM  Type of Therapy:  Group Therapy  Participation Level:  Active  Participation Quality:  Appropriate  Affect:  Appropriate  Cognitive:  Appropriate  Insight:  Appropriate  Engagement in Group:  Engaged  Modes of Intervention:  Discussion  Summary of Progress/Problems:  Patient attended and participated in a goals/ orientation group today.   Elza Rafter 04/25/2022, 9:47 AM

## 2022-04-25 NOTE — Progress Notes (Signed)
   04/25/22 1030  Psych Admission Type (Psych Patients Only)  Admission Status Involuntary  Psychosocial Assessment  Patient Complaints None  Eye Contact Fair  Facial Expression Animated  Affect Appropriate to circumstance  Speech Logical/coherent  Interaction Assertive  Motor Activity Other (Comment) (WDL)  Appearance/Hygiene Unremarkable  Behavior Characteristics Appropriate to situation  Mood Pleasant  Thought Process  Coherency WDL  Content WDL  Delusions None reported or observed  Perception WDL  Hallucination None reported or observed  Judgment Poor  Confusion None  Danger to Self  Current suicidal ideation? Denies  Danger to Others  Danger to Others None reported or observed

## 2022-04-25 NOTE — Progress Notes (Signed)
   04/25/22 0601  15 Minute Checks  Location Hallway  Visual Appearance Calm  Behavior Composed  Sleep (Behavioral Health Patients Only)  Calculate sleep? (Click Yes once per 24 hr at 0600 safety check) Yes  Documented sleep last 24 hours 8

## 2022-04-25 NOTE — Group Note (Signed)
Recreation Therapy Group Note   Group Topic:Team Building  Group Date: 04/25/2022 Start Time: 0930 End Time: 1000 Facilitators: Ryeleigh Santore-McCall, LRT,CTRS Location: [address] Hall Dayroom   Goal Area(s) Addresses:  Patient will effectively work with peer towards shared goal.  Patient will identify skills used to make activity successful.  Patient will identify how skills used during activity can be applied to reach post d/c goals.   Group Description: [employer]. In teams of 5-6, patients were given 11 craft pipe cleaners. Using the materials provided, patients were instructed to compete again the opposing team(s) to build the tallest free-standing structure from floor level. The activity was timed; difficulty increased by Probation officer as Pharmacist, hospital continued.  Systematically resources were removed with additional directions for example, placing one arm behind their back, working in silence, and shape stipulations. LRT facilitated post-activity discussion reviewing team processes and necessary communication skills involved in completion. Patients were encouraged to reflect how the skills utilized, or not utilized, in this activity can be incorporated to positively impact support systems post discharge.   Affect/Mood: Appropriate   Participation Level: Engaged   Participation Quality: Independent   Behavior: Appropriate   Speech/Thought Process: Focused   Insight: Good   Judgement: Good   Modes of Intervention: STEM Activity   Patient Response to Interventions:  Engaged   Education Outcome:  Acknowledges education and In group clarification offered    Clinical Observations/Individualized Feedback: Pt attended and participated in group session.    Plan: Continue to engage patient in RT group sessions 2-3x/week.   Nihal Doan-McCall, LRT,CTRS 04/25/2022 12:18 PM

## 2022-04-25 NOTE — BH IP Treatment Plan (Signed)
Interdisciplinary Treatment and Diagnostic Plan Update  04/25/2022 Time of Session: 9:50 AM ( UPDATE)  Timothy Mccoy MRN: [id number]  Principal Diagnosis: Bipolar disorder, current episode manic severe with psychotic features ([place])  Secondary Diagnoses: Principal Problem:   Bipolar disorder, current episode manic severe with psychotic features ([place]) Active Problems:   ADD (attention deficit disorder)   Insomnia   Anxiety state   Current Medications:  Current Facility-Administered Medications  Medication Dose Route Frequency Provider Last Rate Last Admin   acetaminophen (TYLENOL) tablet 650 mg  650 mg Oral Q6H PRN Derrill Center, NP       albuterol (VENTOLIN HFA) 108 (90 Base) MCG/ACT inhaler 2 puff  2 puff Inhalation Q4H PRN Derrill Center, NP       alum & mag hydroxide-simeth (MAALOX/MYLANTA) 200-200-20 MG/5ML suspension 30 mL  30 mL Oral Q4H PRN Derrill Center, NP       benztropine (COGENTIN) tablet 1 mg  1 mg Oral BID Carrion-Carrero, Margely, MD   1 mg at 04/25/22 [id number]   gabapentin (NEURONTIN) capsule 100 mg  100 mg Oral TID Christene Slates, MD   100 mg at 04/25/22 1203   haloperidol (HALDOL) tablet 2 mg  2 mg Oral QID Carrion-Carrero, Margely, MD   2 mg at 04/25/22 1203   hydrOXYzine (ATARAX) tablet 25 mg  25 mg Oral TID PRN Nicholes Rough, NP   25 mg at 04/21/22 2103   ziprasidone (GEODON) injection 20 mg  20 mg Intramuscular Once Derrill Center, NP       And   LORazepam (ATIVAN) tablet 1 mg  1 mg Oral PRN Derrill Center, NP       magnesium hydroxide (MILK OF MAGNESIA) suspension 30 mL  30 mL Oral Daily PRN Derrill Center, NP       melatonin tablet 5 mg  5 mg Oral QHS PRN Bobbitt, Shalon E, NP   5 mg at 04/23/22 2114   naproxen (NAPROSYN) tablet 500 mg  500 mg Oral BID PRN Nicholes Rough, NP   500 mg at 04/19/22 1306   nicotine (NICODERM CQ - dosed in mg/24 hr) patch 7 mg  7 mg Transdermal Daily Attiah, Nadir, MD   7 mg at 04/25/22 0955   sertraline (ZOLOFT)  tablet 50 mg  50 mg Oral Daily Carrion-Carrero, Margely, MD   50 mg at 04/25/22 [id number]   Vitamin D (Ergocalciferol) (DRISDOL) 1.25 MG (50000 UNIT) capsule 50,000 Units  50,000 Units Oral Q7 days Nicholes Rough, NP   50,000 Units at 04/18/22 2122   PTA Medications: Medications Prior to Admission  Medication Sig Dispense Refill Last Dose   albuterol (VENTOLIN HFA) 108 (90 Base) MCG/ACT inhaler Inhale 2 puffs into the lungs every 4 (four) hours as needed for wheezing or shortness of breath. 18 g 0    ondansetron (ZOFRAN) 8 MG tablet Take 1 tablet (8 mg total) by mouth every 8 (eight) hours as needed for nausea. (Patient not taking: Reported on 04/16/2022) 14 tablet 0    pantoprazole (PROTONIX) 40 MG tablet Take 1 tablet (40 mg total) by mouth daily. (Patient not taking: Reported on 04/16/2022) 30 tablet 3     Patient Stressors: Soil scientist issue   Marital or family conflict    Patient Strengths: Capable of independent living   Treatment Modalities: Medication Management, Group therapy, Case management,  1 to 1 session with clinician, Psychoeducation, Recreational therapy.   Physician Treatment Plan for Primary Diagnosis: Bipolar  disorder, current episode manic severe with psychotic features ([place]) Long Term Goal(s): Improvement in symptoms so as ready for discharge   Short Term Goals: Ability to identify changes in lifestyle to reduce recurrence of condition will improve Ability to verbalize feelings will improve  Medication Management: Evaluate patient's response, side effects, and tolerance of medication regimen.  Therapeutic Interventions: 1 to 1 sessions, Unit Group sessions and Medication administration.  Evaluation of Outcomes: Progressing  Physician Treatment Plan for Secondary Diagnosis: Principal Problem:   Bipolar disorder, current episode manic severe with psychotic features ([place]) Active Problems:   ADD (attention deficit disorder)   Insomnia   Anxiety  state  Long Term Goal(s): Improvement in symptoms so as ready for discharge   Short Term Goals: Ability to identify changes in lifestyle to reduce recurrence of condition will improve Ability to verbalize feelings will improve     Medication Management: Evaluate patient's response, side effects, and tolerance of medication regimen.  Therapeutic Interventions: 1 to 1 sessions, Unit Group sessions and Medication administration.  Evaluation of Outcomes: Progressing   RN Treatment Plan for Primary Diagnosis: Bipolar disorder, current episode manic severe with psychotic features ([place]) Long Term Goal(s): Knowledge of disease and therapeutic regimen to maintain health will improve  Short Term Goals: Ability to remain free from injury will improve, Ability to verbalize frustration and anger appropriately will improve, Ability to participate in decision making will improve, Ability to verbalize feelings will improve, Ability to identify and develop effective coping behaviors will improve, and Compliance with prescribed medications will improve  Medication Management: RN will administer medications as ordered by provider, will assess and evaluate patient's response and provide education to patient for prescribed medication. RN will report any adverse and/or side effects to prescribing provider.  Therapeutic Interventions: 1 on 1 counseling sessions, Psychoeducation, Medication administration, Evaluate responses to treatment, Monitor vital signs and CBGs as ordered, Perform/monitor CIWA, COWS, AIMS and Fall Risk screenings as ordered, Perform wound care treatments as ordered.  Evaluation of Outcomes: Progressing   LCSW Treatment Plan for Primary Diagnosis: Bipolar disorder, current episode manic severe with psychotic features ([place]) Long Term Goal(s): Safe transition to appropriate next level of care at discharge, Engage patient in therapeutic group addressing interpersonal concerns.  Short Term  Goals: Engage patient in aftercare planning with referrals and resources, Increase social support, Increase emotional regulation, Facilitate acceptance of mental health diagnosis and concerns, Identify triggers associated with mental health/substance abuse issues, and Increase skills for wellness and recovery  Therapeutic Interventions: Assess for all discharge needs, 1 to 1 time with Social worker, Explore available resources and support systems, Assess for adequacy in community support network, Educate family and significant other(s) on suicide prevention, Complete Psychosocial Assessment, Interpersonal group therapy.  Evaluation of Outcomes: Progressing  Progress in Treatment: Attending groups: Yes. Participating in groups: Yes. Taking medication as prescribed: Yes. Toleration medication: Yes. Family/Significant other contact made: Yes, individual(s) contacted:  [name], Mother Patient understands diagnosis: Yes. Discussing patient identified problems/goals with staff: Yes. Medical problems stabilized or resolved: Yes. Denies suicidal/homicidal ideation: Yes. Issues/concerns per patient self-inventory: Yes. Other:    New problem(s) identified: No, Describe:  None Known   New Short Term/Long Term Goal(s): medication stabilization, elimination of SI thoughts, development of comprehensive mental wellness plan.     Patient Goals:  Making sure that take all of my medication   Discharge Plan or Barriers: CSW will continue to follow and assess for appropriate referrals and possible discharge planning.  Reason for Continuation of Hospitalization: Anxiety Delusions  Hallucinations Mania Medication stabilization   Estimated Length of Stay: 3-7 Days    Last [place] Suicide Severity Risk Score: [place] Admission (Current) from 04/16/2022 in [place] [facility] ED from 04/15/2022 in [facility] Emergency Department at [place] No Risk Moderate Risk       Last PHQ 2/9 Scores:    07/20/2020    1:57 PM 11/30/2014    8:48 AM  Depression screen PHQ 2/9  Decreased Interest 0 0  Down, Depressed, Hopeless 0 0  PHQ - 2 Score 0 0    Scribe for Treatment Team: Charlett Lango 04/25/2022 3:44 PM

## 2022-04-26 DIAGNOSIS — F312 Bipolar disorder, current episode manic severe with psychotic features: Secondary | ICD-10-CM | POA: Diagnosis not present

## 2022-04-26 NOTE — Progress Notes (Signed)
[facility] MD Progress Note  04/26/2022 8:41 AM Timothy Mccoy  MRN:  [id number]  Principal Problem: Bipolar disorder, current episode manic severe with psychotic features ([place]) Diagnosis: Principal Problem:   Bipolar disorder, current episode manic severe with psychotic features [facility]) Active Problems:   ADD (attention deficit disorder)   Insomnia   Anxiety state   Reason for Admission:  Timothy Mccoy is a 37 year old Caucasian male with a self-reported history of ADHD who presented to the [facility] Department with complaints that someone was after him, and demanded to be incarcerated for his safety. He also reported SI with a plan, and was transferred to the [facility], and later [facility] prior to being transferred to this behavioral health Hospital for treatment and stabilization of his mental status.  (admitted on 04/16/2022, total  LOS: 10 days )   Information Obtained Today During Patient Interview:  Patient evaluated on unit, cheerfully greets this interviewer. Sates "I am in a good mood", endorses adequate sleep and appetite. Rates his depression and anxiety are both 0/10. Tolerating psych meds with no adverse symptoms to report. VS appear stable on chart review. He denies any suicidal ideation. Denies any paranoid ideations. Reports he spoke to mother yesterday, states it was a productive conversation.   On assessment, he denies homicidal ideation. He denies auditory and visual hallucinations. Denies thought insertion, thought withdrawal, and ideas of reference.   Patient denies any somatic complaints.   Collateral call with mother, [name] [phone]:  Reports that she spoke to patient last night.  Believes the patient is at baseline as he did not endorse any paranoid ideations during the conversation.  Patient's current diagnosis was explained as evidenced by specific signs/symptoms.  Assisted [name] with understanding implications for treatment based  on diagnosis and current level of behaviors/symptoms. [name] asked pertinent questions pertaining to understanding diagnosis and implications for treatment.  Past Psychiatric Hx: Previous Psych Diagnoses: ADHD Prior inpatient treatment: Denies Current/prior outpatient treatment: None Prior rehab hx: None Psychotherapy hx: None History of suicide attempt: None  history of homicide or aggression: Assault charge regarding girlfriend   Psychiatric medication history: Adderall, Concerta, Ritalin, states he last took any of this medications 15 years ago.  Denies any medication currently, denies any other medication trials.   Psychiatric medication compliance history: Compliant, mother states that he stopped taking the stimulants above because he did not like the way it makes him feel.   Neuromodulation history: Denies Current Psychiatrist: None Current therapist: None   Substance Abuse Hx: Alcohol: Denies use Tobacco: 2 to 3 packs/week   Illicit drugs: Denies current use, states he has tried marijuana in the past but has not used in over a year. Rx drug abuse: Denies Rehab hx: Denies   Past Medical History: Medical Diagnoses: Denies Home Rx: Denies Prior Hosp: Denies Prior Surgeries/Trauma: Denies Head trauma, LOC, concussions, seizures: Denies Allergies: Denies LMP: N/A Contraception: Denies PCP: Denies having any at this time   Family History: Medical: Denies Psych: Denies Psych Rx: Denies SA/HA at: Denies Substance use family hx: Denies   Social History: Patient reports that he was born and raised in [place], states he never knew his dad because he passed away when he was 68 months old, states he was killed by a drunk driver.  He reports a good support system in his mother and his stepfather.  He reports that he currently resides with his mother and stepfather and his 39 year old son.  He denies  that money is a problem at this time.   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   Peer Group: Denies Housing: With mother and step father and 80 year old son Finances: Denies this being a Insurance underwriter: Domestic violence Garment/textile technologist: Denies    Current Medications: Current Facility-Administered Medications  Medication Dose Route Frequency Provider Last Rate Last Admin   acetaminophen (TYLENOL) tablet 650 mg  650 mg Oral Q6H PRN Derrill Center, NP       albuterol (VENTOLIN HFA) 108 (90 Base) MCG/ACT inhaler 2 puff  2 puff Inhalation Q4H PRN Derrill Center, NP       alum & mag hydroxide-simeth (MAALOX/MYLANTA) 200-200-20 MG/5ML suspension 30 mL  30 mL Oral Q4H PRN Derrill Center, NP   30 mL at 04/26/22 0834   benztropine (COGENTIN) tablet 1 mg  1 mg Oral BID Carrion-Carrero, Lakendrick Paradis, MD   1 mg at 04/26/22 [id number]   gabapentin (NEURONTIN) capsule 100 mg  100 mg Oral TID Christene Slates, MD   100 mg at 04/26/22 [id number]   haloperidol (HALDOL) tablet 2 mg  2 mg Oral QID Carrion-Carrero, Ionna Avis, MD   2 mg at 04/26/22 [id number]   hydrOXYzine (ATARAX) tablet 25 mg  25 mg Oral TID PRN Nicholes Rough, NP   25 mg at 04/21/22 2103   ziprasidone (GEODON) injection 20 mg  20 mg Intramuscular Once Derrill Center, NP       And   LORazepam (ATIVAN) tablet 1 mg  1 mg Oral PRN Derrill Center, NP       magnesium hydroxide (MILK OF MAGNESIA) suspension 30 mL  30 mL Oral Daily PRN Derrill Center, NP       melatonin tablet 5 mg  5 mg Oral QHS PRN Bobbitt, Shalon E, NP   5 mg at 04/23/22 2114   naproxen (NAPROSYN) tablet 500 mg  500 mg Oral BID PRN Nicholes Rough, NP   500 mg at 04/19/22 1306   nicotine (NICODERM CQ - dosed in mg/24 hr) patch 7 mg  7 mg Transdermal Daily Attiah, Nadir,  MD   7 mg at 04/26/22 0835   sertraline (ZOLOFT) tablet 50 mg  50 mg Oral Daily Carrion-Carrero, Asta Corbridge, MD   50 mg at 04/26/22 [id number]   Vitamin D (Ergocalciferol) (DRISDOL) 1.25 MG (50000 UNIT) capsule 50,000 Units  50,000 Units Oral Q7 days Nicholes Rough, NP   50,000 Units at 04/25/22 2154    Lab Results:  No results found for this or any previous visit (from the past 48 hour(s)).   Blood Alcohol level:  Lab Results  Component Value Date   ETH <10 [id number]    Metabolic Labs: Lab Results  Component Value Date   HGBA1C 5.4 04/18/2022   MPG 108 04/18/2022   No results found for: "PROLACTIN" Lab Results  Component Value Date   CHOL 138 04/18/2022   TRIG 115 04/18/2022   HDL 38 (L) 04/18/2022   CHOLHDL 3.6 04/18/2022   VLDL 23 04/18/2022   LDLCALC 77 04/18/2022   LDLCALC 97 06/01/2013    Sleep:Sleep: Good Number of Hours of Sleep: 8   Physical Findings: AIMS: 0  CIWA:    COWS:     Psychiatric Specialty  Exam:  Presentation  General Appearance: Appropriate for Environment; Casual; Fairly Groomed  Eye Contact:Fair  Speech:Clear and Coherent; Normal Rate  Speech Volume:Normal  Handedness:Right   Mood and Affect  Mood:-- ("I'm in a good mood")  Affect:Appropriate; Full Range; Congruent   Thought Process  Thought Processes:Coherent; Goal Directed; Linear  Descriptions of Associations:Intact  Orientation:Full (Time, Place and Person)  Thought Content:Logical; WDL  History of Schizophrenia/Schizoaffective disorder:No  Duration of Psychotic Symptoms:N/A  Hallucinations:Hallucinations: None  Ideas of Reference:None  Suicidal Thoughts:Suicidal Thoughts: No  Homicidal Thoughts:Homicidal Thoughts: No   Sensorium  Memory:Immediate Fair  Judgment:Fair  Insight:Fair   Executive Functions  Concentration:Fair  Attention Span:Fair  [place]   Psychomotor Activity  Psychomotor  Activity:Psychomotor Activity: Normal; Extrapyramidal Side Effects (EPS) Extrapyramidal Side Effects (EPS): Dystonia AIMS Completed?: No   Assets  Assets:Communication Skills; Desire for Improvement; Resilience   Sleep  Sleep:Sleep: Good Number of Hours of Sleep: 8    Physical Exam: Physical Exam Constitutional:      General: He is not in acute distress.    Appearance: Normal appearance. He is not ill-appearing.  Pulmonary:     Effort: Pulmonary effort is normal. No respiratory distress.  Musculoskeletal:        General: Normal range of motion.  Neurological:     Mental Status: He is alert.    Review of Systems  Respiratory:  Negative for shortness of breath.   Cardiovascular:  Negative for chest pain.  Gastrointestinal:  Negative for abdominal pain, constipation and diarrhea.  Neurological:  Negative for dizziness and headaches.  Psychiatric/Behavioral:  Negative for depression, hallucinations, substance abuse and suicidal ideas. The patient is not nervous/anxious and does not have insomnia.    Blood pressure 117/74, pulse 91, temperature 97.6 F (36.4 C), temperature source Oral, resp. rate 16, height 5\' 8"  (1.727 m), weight 86.2 kg, SpO2 97 %. Body mass index is 28.89 kg/m.  Treatment Plan Summary: Daily contact with patient to assess and evaluate symptoms and progress in treatment and Medication management   ASSESSMENT: Long Term Goal(s): Improvement in symptoms so as ready for discharge   Short Term Goals: Ability to identify changes in lifestyle to reduce recurrence of condition will improve, Ability to disclose and discuss suicidal ideas, Ability to demonstrate self-control will improve, Ability to identify and develop effective coping behaviors will improve, Compliance with prescribed medications will improve, and Ability to identify triggers associated with substance abuse/mental health issues will improve   Diagnoses:  Principal Problem:   Bipolar disorder,  current episode manic severe with psychotic features ([place]) Active Problems:   ADD (attention deficit disorder)   Insomnia   Anxiety state   PLAN: Safety and Monitoring:  -- INVOLUNTARY admission to inpatient psychiatric unit for safety, stabilization and treatment  -- Daily contact with patient to assess and evaluate symptoms and progress in treatment  -- Patient's case to be discussed in multi-disciplinary team meeting  -- Observation Level : q15 minute checks  -- Vital signs:  q12 hours  -- Precautions: suicide, elopement, and assault  2. Psychiatric Diagnoses and Treatment:  Continue Haldol 2 mg QID for paranoia Continue Cogentin 1 mg twice daily, for dystonia and other EPS prophylaxis Continue Gabapentin 100 mg 3 times daily for anxiety Continue Zoloft 50 mg daily for anxiety Continue Hydroxyzine 25 mg as needed 3 times daily Continue Geodon/Ativan for PRN agitation  Other PRNS -Continue Tylenol 650 mg every 6 hours PRN for mild pain -Continue Maalox 30  mg every 4 hrs PRN for indigestion -Continue Milk of Magnesia as needed every 6 hrs for constipation  -- The risks/benefits/side-effects/alternatives to this medication were discussed in detail with the patient and time was given for questions. The patient consents to medication trial.              -- Metabolic profile and EKG monitoring obtained while on an atypical antipsychotic  BMI: 28.89 kg/m TSH: 1.821, WNL Lipid Panel: HDL 38, other values WNL HbgA1c: 5.4%, at goal QTc: 433             -- Encouraged patient to participate in unit milieu and in scheduled group therapies   -- Short Term Goals: Ability to identify changes in lifestyle to reduce recurrence of condition will improve and Ability to verbalize feelings will improve  -- Long Term Goals: Improvement in symptoms so as ready for discharge    3. Medical Issues Being Addressed:   Tobacco Use Disorder  -- Nicotine patch 21mg /24 hours ordered  -- Smoking  cessation encouraged   Shortness of Breath  --Albuterol PRN   Orthostatic hypotension, symptomatic  -- Discontinue offending agent  --Continue monitoring vitals daily  4. Discharge Planning:   -- Social work and case management to assist with discharge planning and identification of hospital follow-up needs prior to discharge  -- Estimated LOS: Tomorrow, 3/29  -- Discharge Concerns: Need to establish a safety plan; Medication compliance and effectiveness  -- Discharge Goals: Return home with outpatient referrals for mental health follow-up including medication management/psychotherapy     I certify that inpatient services furnished can reasonably be expected to improve the patient's condition.    Dr. Jacques Navy, MD PGY-1, Psychiatry Residency  3/28/20248:41 AM

## 2022-04-26 NOTE — [facility] Group Notes (Signed)
[place] Group Notes:  (Nursing/MHT/Case Management/Adjunct)  Date:  04/26/2022  Time:  8:51 PM  Type of Therapy:  Group Therapy  Participation Level:  Active  Participation Quality:  Appropriate  Affect:  Appropriate  Cognitive:  Appropriate  Insight:  Appropriate  Engagement in Group:  Engaged  Modes of Intervention:  Discussion  Summary of Progress/Problems: Goal was to be more active today, felt he met that goal  Timothy Mccoy 04/26/2022, 8:51 PM

## 2022-04-26 NOTE — Group Note (Signed)
Date:  04/26/2022 Time:  10:03 AM  Group Topic/Focus:  Orientation:   The focus of this group is to educate the patient on the purpose and policies of crisis stabilization and provide a format to answer questions about their admission.  The group details unit policies and expectations of patients while admitted.    Participation Level:  Active  Participation Quality:  Appropriate  Affect:  Appropriate  Cognitive:  Appropriate  Insight: Appropriate  Engagement in Group:  Engaged  Modes of Intervention:  Discussion  Additional Comments:     Jerrye Beavers 04/26/2022, 10:03 AM

## 2022-04-26 NOTE — Plan of Care (Signed)
  Problem: Safety: Goal: Periods of time without injury will increase Outcome: Progressing   

## 2022-04-26 NOTE — Progress Notes (Signed)
   04/26/22 1600  Psychosocial Assessment  Patient Complaints None  Eye Contact Fair  Facial Expression Animated  Affect Appropriate to circumstance  Speech Logical/coherent  Interaction Assertive  Motor Activity Other (Comment) (wdl)  Appearance/Hygiene Unremarkable  Behavior Characteristics Cooperative  Mood Pleasant;Euthymic  Thought Process  Coherency WDL  Content WDL  Delusions None reported or observed  Perception WDL  Hallucination None reported or observed  Judgment WDL  Confusion None  Danger to Self  Current suicidal ideation? Denies  Agreement Not to Harm Self Yes  Description of Agreement verbal contract  Danger to Others  Danger to Others None reported or observed

## 2022-04-26 NOTE — Group Note (Signed)
Date:  04/26/2022 Time:  5:29 PM  Group Topic/Focus:  Spirituality:   The focus of this group is to discuss how one's spirituality can aide in recovery.    Participation Level:  Active  Participation Quality:  Appropriate  Affect:  Appropriate  Cognitive:  Appropriate  Insight: Appropriate  Engagement in Group:  Engaged  Modes of Intervention:  Exploration  Additional Comments:     Jerrye Beavers 04/26/2022, 5:29 PM

## 2022-04-26 NOTE — Group Note (Unsigned)
Date:  04/26/2022 Time:  5:17 PM  Group Topic/Focus:  Spirituality:   The focus of this group is to discuss how one's spirituality can aide in recovery.     Participation Level:  {[facility] PARTICIPATION WO:[id number]  Participation Quality:  {[facility] PARTICIPATION QUALITY:22265}  Affect:  {[facility] AFFECT:22266}  Cognitive:  {[facility] COGNITIVE:22267}  Insight: {B[facility] Insight2:20797}  Engagement in Group:  {BHH ENGAGEMENT IN BP:[id number]  Modes of Intervention:  {[facility] MODES OF INTERVENTION:22269}  Additional Comments:  ***  Jerrye Beavers 04/26/2022, 5:17 PM

## 2022-04-26 NOTE — [facility] Group Notes (Addendum)
PsychoEducational Group Patients were asked to reflect on how to promote wellbeing with healthy lifestyle choices. Education regarding healthy sleep habits, and lifestyle impact mental health. Conversely patients were then asked to identify warning signs of when they are not well and how to integrate healthy coping skills/lifestyle to help.  Patient did not attend.Marland Kitchen

## 2022-04-26 NOTE — Progress Notes (Signed)
   04/26/22 2200  Psych Admission Type (Psych Patients Only)  Admission Status Involuntary  Psychosocial Assessment  Patient Complaints None  Eye Contact Fair  Facial Expression Animated  Affect Appropriate to circumstance  Speech Logical/coherent  Interaction Assertive  Motor Activity Other (Comment)  Appearance/Hygiene Unremarkable  Behavior Characteristics Cooperative  Mood Pleasant  Thought Process  Coherency WDL  Content WDL  Delusions None reported or observed  Perception WDL  Hallucination None reported or observed  Judgment WDL  Confusion None  Danger to Self  Current suicidal ideation? Denies  Self-Injurious Behavior No self-injurious ideation or behavior indicators observed or expressed   Agreement Not to Harm Self Yes  Description of Agreement verbal  Danger to Others  Danger to Others None reported or observed   Alert/oriented. Makes needs/concerns known to staff. Pleasant cooperative with staff. Denies SI/HI/A/V hallucinations. Med compliant. PRN med given with good effect. Patient states went to group. Will encourage continued compliance and progression towards goals. Verbally contracted for safety. Will continue to monitor.

## 2022-04-27 DIAGNOSIS — F312 Bipolar disorder, current episode manic severe with psychotic features: Secondary | ICD-10-CM | POA: Diagnosis not present

## 2022-04-27 MED ORDER — PANTOPRAZOLE SODIUM 40 MG PO TBEC
40.0000 mg | DELAYED_RELEASE_TABLET | Freq: Every day | ORAL | Status: DC
  Administered 2022-04-27: 40 mg via ORAL
  Filled 2022-04-27 (×2): qty 7
  Filled 2022-04-27: qty 1

## 2022-04-27 MED ORDER — SERTRALINE HCL 50 MG PO TABS: 50.0000 mg | ORAL_TABLET | Freq: Every day | ORAL | 0 refills | Status: AC

## 2022-04-27 MED ORDER — ONDANSETRON HCL 4 MG PO TABS: 8.0000 mg | ORAL_TABLET | Freq: Three times a day (TID) | ORAL | Status: DC | PRN

## 2022-04-27 MED ORDER — HALOPERIDOL 2 MG PO TABS
2.0000 mg | ORAL_TABLET | Freq: Three times a day (TID) | ORAL | Status: DC
  Administered 2022-04-27: 2 mg via ORAL
  Filled 2022-04-27 (×4): qty 1

## 2022-04-27 MED ORDER — MELATONIN 5 MG PO TABS: 5.0000 mg | ORAL_TABLET | Freq: Every evening | ORAL | 0 refills | Status: AC | PRN

## 2022-04-27 MED ORDER — HALOPERIDOL 2 MG PO TABS: 2.0000 mg | ORAL_TABLET | Freq: Three times a day (TID) | ORAL | 0 refills | Status: AC

## 2022-04-27 MED ORDER — NICOTINE 7 MG/24HR TD PT24: 7.0000 mg | MEDICATED_PATCH | Freq: Every day | TRANSDERMAL | 0 refills | Status: AC

## 2022-04-27 MED ORDER — BENZTROPINE MESYLATE 1 MG PO TABS: 1.0000 mg | ORAL_TABLET | Freq: Two times a day (BID) | ORAL | 0 refills | Status: AC | PRN

## 2022-04-27 MED ORDER — VITAMIN D (ERGOCALCIFEROL) 1.25 MG (50000 UNIT) PO CAPS: 50000.0000 [IU] | ORAL_CAPSULE | ORAL | 0 refills | Status: AC

## 2022-04-27 MED ORDER — GABAPENTIN 100 MG PO CAPS: 100.0000 mg | ORAL_CAPSULE | Freq: Three times a day (TID) | ORAL | 0 refills | Status: AC

## 2022-04-27 NOTE — Progress Notes (Addendum)
  [facility] Adult Case Management Discharge Plan :  Will you be returning to the same living situation after discharge:  Yes,  Patient will be returning back with his mom At discharge, do you have transportation home?: Yes,  Patient mom will be picking him up  Do you have the ability to pay for your medications: No. CSW provided patient with RX discount cards  Release of information consent forms completed and in the chart;  Patient's signature needed at discharge.  Patient to Follow up at:  Follow-up Information     Services, [facility]. Go on 04/30/2022.   Why: You have a hospital follow up appointment for therapy and medication management services on 04/30/22 at 8:00 am.  This appointment will be held in person. Contact information: [place] [address] [phone]                   Next level of care provider has access to [place] and Suicide Prevention discussed: Velta Addison  [name] [phone] (Mother)      Has patient been referred to the Quitline?: Yes, faxed on 04/27/2022  Patient has been referred for addiction treatment: N/A. Patient does not have an addiction   Sherre Lain, LCSWA 04/27/2022, 9:48 AM

## 2022-04-27 NOTE — [facility] Suicide Risk Assessment (Signed)
Suicide Risk Assessment  Discharge Assessment    [facility] Discharge Suicide Risk Assessment   Principal Problem: Bipolar disorder, current episode manic severe with psychotic features Discharge Diagnoses: Principal Problem:   Bipolar disorder, current episode manic severe with psychotic features ([place]) Active Problems:   ADD (attention deficit disorder)   Insomnia   Anxiety state   Total Time spent with patient: 15 minutes  Timothy Mccoy is a 37 year old Caucasian male with a self-reported history of ADHD who presented to the [facility] with complaints that someone was after him, and demanded to be incarcerated for his safety. He also reported SI with a plan, and was transferred to the [facility], and later [facility] prior to being transferred to this behavioral health Hospital for treatment and stabilization of his mental status.   During the patient's hospitalization, patient had extensive initial psychiatric evaluation, and follow-up psychiatric evaluations every day.  Psychiatric diagnoses provided upon initial assessment:  Bipolar disorder, current episode manic severe with psychotic features ED Insomnia Anxiety state  Patient's psychiatric medications were adjusted on admission:  -Start Risperdal 1 mg twice daily for psychosis/mood stabilization -Start trazodone 50 mg nightly for sleep -Start hydroxyzine 25 mg as needed 3 times daily -Continue Geodon/Ativan for agitation as needed-please see tomorrow for complete order -Continue albuterol as needed for wheezing/shortness of breath  During the hospitalization, other adjustments were made to the patient's psychiatric medication regimen:  During course of patient's hospitalization, Risperdal was titrated to 1 mg daily +3 mg nightly, which was then titrated to Risperdal 2 mg daily +4 mg nightly Risperdal was discontinued due to continued symptoms of paranoia and symptomatic orthostatic  hypotension At this time he also began to exhibit mild dystonia at the wrists Started Benadryl 25 mg nightly for dystonia and sleep  Patient was then changed to Haldol 5 mg daily + 5 mg nightly At this dose patient continued to experience symptoms of paranoia and symptomatic orthostatic hypotension By the end of the patient's hospitalization his psychiatric medications were titrated to the following with resolution of paranoia and no reported adverse effects: Haldol 2 mg QID for paranoia Cogentin 1 mg twice daily, for dystonia and other EPS prophylaxis Discontinued Benadryl Gabapentin 100 mg 3 times daily for anxiety Zoloft 50 mg daily for anxiety Hydroxyzine 25 mg as needed 3 times daily Geodon/Ativan for PRN agitation This writer would like to note that patient's psychiatric symptoms of paranoia resolved significantly once with the addition of gabapentin and Zoloft.  Patient reported that he responded well to these medications.   Patient's care was discussed during the interdisciplinary team meeting every day during the hospitalization.  The patient denies having side effects to prescribed psychiatric medication.  Gradually, patient started adjusting to milieu. The patient was evaluated each day by a clinical provider to ascertain response to treatment. Improvement was noted by the patient's report of decreasing symptoms, improved sleep and appetite, affect, medication tolerance, behavior, and participation in unit programming.  Patient was asked each day to complete a self inventory noting mood, mental status, pain, new symptoms, anxiety and concerns.    Symptoms were reported as significantly decreased or resolved completely by discharge.   On day of discharge, the patient reports that their mood is stable. The patient denied having suicidal thoughts for more than 48 hours prior to discharge.  Patient denies having homicidal thoughts.  Patient denies having auditory hallucinations.   Patient denies any visual hallucinations or other symptoms of psychosis. The patient  was motivated to continue taking medication with a goal of continued improvement in mental health.   The patient reports their target psychiatric symptoms of paranoia, insomnia, and anxiety responded well to the psychiatric medications, and the patient reports overall benefit other psychiatric hospitalization. Supportive psychotherapy was provided to the patient. The patient also participated in regular group therapy while hospitalized. Coping skills, problem solving as well as relaxation therapies were also part of the unit programming.  Labs were reviewed with the patient, and abnormal results were discussed with the patient.  The patient is able to verbalize their individual safety plan to this provider.  # It is recommended to the patient to continue psychiatric medications as prescribed, after discharge from the hospital.    # It is recommended to the patient to follow up with your outpatient psychiatric provider and PCP.  # It was discussed with the patient, the impact of alcohol, drugs, tobacco have been there overall psychiatric and medical wellbeing, and total abstinence from substance use was recommended the patient.ed.  # Prescriptions provided or sent directly to preferred pharmacy at discharge. Patient agreeable to plan. Given opportunity to ask questions. Appears to feel comfortable with discharge.    # In the event of worsening symptoms, the patient is instructed to call the crisis hotline, 911 and or go to the nearest ED for appropriate evaluation and treatment of symptoms. To follow-up with primary care provider for other medical issues, concerns and or health care needs  # Patient was discharged home with a plan to follow up as noted below.    Musculoskeletal: Strength & Muscle Tone: within normal limits Gait & Station: normal Patient leans: N/A  Psychiatric Specialty Exam:    Presentation  General Appearance: Appropriate for Environment; Casual; Fairly Groomed   Eye Contact:Fair   Speech:Clear and Coherent; Normal Rate   Speech Volume:Normal   Handedness:Right     Mood and Affect  Mood:-- ("Very good")   Affect:Appropriate; Full Range; Congruent     Thought Process  Thought Processes:Coherent; Goal Directed; Linear   Descriptions of Associations:Intact   Orientation:Full (Time, Place and Person)   Thought Content:Logical; WDL   History of Schizophrenia/Schizoaffective disorder:No   Duration of Psychotic Symptoms:N/A   Hallucinations:Hallucinations: None   Ideas of Reference:None   Suicidal Thoughts:Suicidal Thoughts: No   Homicidal Thoughts:Homicidal Thoughts: No     Sensorium  Memory:Immediate Fair   Judgment:Fair   Insight:Fair     Executive Functions  Concentration:Fair   Attention Span:Fair   [place]     Psychomotor Activity  Psychomotor Activity:Psychomotor Activity: Normal; Extrapyramidal Side Effects (EPS) Extrapyramidal Side Effects (EPS): Dystonia AIMS Completed?: No     Assets  Assets:Communication Skills; Desire for Improvement; Resilience     Sleep  Sleep:Sleep: Good Number of Hours of Sleep: 8       Physical Exam: Physical Exam Constitutional:      General: He is not in acute distress.    Appearance: Normal appearance. He is not ill-appearing.  Pulmonary:     Effort: Pulmonary effort is normal. No respiratory distress.  Musculoskeletal:        General: Normal range of motion.  Neurological:     Mental Status: He is alert.      Review of Systems  Respiratory:  Negative for shortness of breath.   Cardiovascular:  Negative for chest pain.  Gastrointestinal:  Negative for abdominal pain, constipation and diarrhea.  Neurological:  Negative for dizziness and  headaches.  Psychiatric/Behavioral:  Negative for depression, hallucinations,  substance abuse and suicidal ideas. The patient is not nervous/anxious and does not have insomnia.   Blood pressure 121/82, pulse 87, temperature (!) 97.3 F (36.3 C), temperature source Oral, resp. rate 20, height 5\' 8"  (1.727 m), weight 86.2 kg, SpO2 97 %. Body mass index is 28.89 kg/m.  Mental Status Per Nursing Assessment::   On Admission:  NA  Demographic factors:  Male Current Mental Status:  NA Loss Factors:  Financial problems / change in socioeconomic status, Loss of significant relationship Historical Factors:  NA Risk Reduction Factors:  Living with another person, especially a relative   Continued Clinical Symptoms:  More than one psychiatric diagnosis Previous Psychiatric Diagnoses and Treatments  Cognitive Features That Contribute To Risk:  None    Suicide Risk:  Mild: There are no identifiable suicide plans, no associated intent, mild dysphoria and related symptoms, good self-control (both objective and subjective assessment), few other risk factors, and identifiable protective factors, including available and accessible social support.    Follow-up Information     Services, [facility]. Go on 04/24/2022.   Why: You have a hospital follow up appointment for therapy and medication management services on 04/24/22 at 11:00 am.  This appointment will be held in person. Contact information: [place] [address] [phone]                 Plan Of Care/Follow-up recommendations:  Activity: as tolerated  Diet: heart healthy  Other: -Follow-up with your outpatient psychiatric provider -instructions on appointment date, time, and address (location) are provided to you in discharge paperwork.  -Take your psychiatric medications as prescribed at discharge - instructions are provided to you in the discharge paperwork  -Follow-up with outpatient primary care doctor and other specialists -for management of preventative medicine and chronic  medical disease, including:   Asthma, stable at discharge Vitamin D deficiency, stable at discharge Vit D, 25-Hydroxy 21.36 Low    -Testing: Follow-up with outpatient provider for abnormal lab results: See above  -Recommend abstinence from alcohol, tobacco, and other illicit drug use at discharge.   -If your psychiatric symptoms recur, worsen, or if you have side effects to your psychiatric medications, call your outpatient psychiatric provider, 911, [phone] or go to the nearest emergency department.  -If suicidal thoughts recur, call your outpatient psychiatric provider, 911, [phone] or go to the nearest emergency department.     Christene Slates, MD 04/27/2022, 7:14 AM

## 2022-04-27 NOTE — Progress Notes (Signed)
Discharge note: RN met with pt and reviewed pt's discharge instructions. Pt verbalized understanding of discharge instructions and pt did not have any questions. RN reviewed and provided pt with a copy of SRA, AVS and Transition Record. RN returned pt's belongings to pt. Medication samples were given to patient. Pt denied SI/HI/AVH and voiced no concerns. Pt was appreciative of the care pt received at [facility]. Patient discharged to the lobby without incident.
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
